# Patient Record
Sex: Female | Born: 1935 | Race: White | Hispanic: No | Marital: Married | Smoking: Never smoker
Health system: Southern US, Community
[De-identification: ages and names within clinical notes are randomized; demographics above are authoritative.]

## PROBLEM LIST (undated history)

## (undated) DIAGNOSIS — M419 Scoliosis, unspecified: Secondary | ICD-10-CM

## (undated) DIAGNOSIS — M810 Age-related osteoporosis without current pathological fracture: Secondary | ICD-10-CM

## (undated) DIAGNOSIS — M1990 Unspecified osteoarthritis, unspecified site: Secondary | ICD-10-CM

## (undated) HISTORY — PX: ABDOMINAL HYSTERECTOMY: SHX81

---

## 2017-01-08 ENCOUNTER — Emergency Department (HOSPITAL_COMMUNITY): Payer: Medicare Other

## 2017-01-08 ENCOUNTER — Inpatient Hospital Stay (HOSPITAL_COMMUNITY)
Admission: EM | Admit: 2017-01-08 | Discharge: 2017-01-21 | DRG: 208 | Disposition: E | Payer: Medicare Other | Attending: General Surgery | Admitting: General Surgery

## 2017-01-08 ENCOUNTER — Encounter (HOSPITAL_COMMUNITY): Payer: Self-pay

## 2017-01-08 DIAGNOSIS — N179 Acute kidney failure, unspecified: Secondary | ICD-10-CM | POA: Diagnosis not present

## 2017-01-08 DIAGNOSIS — S2241XA Multiple fractures of ribs, right side, initial encounter for closed fracture: Principal | ICD-10-CM | POA: Diagnosis present

## 2017-01-08 DIAGNOSIS — Z515 Encounter for palliative care: Secondary | ICD-10-CM | POA: Diagnosis not present

## 2017-01-08 DIAGNOSIS — E876 Hypokalemia: Secondary | ICD-10-CM | POA: Diagnosis not present

## 2017-01-08 DIAGNOSIS — J9601 Acute respiratory failure with hypoxia: Secondary | ICD-10-CM

## 2017-01-08 DIAGNOSIS — S2249XA Multiple fractures of ribs, unspecified side, initial encounter for closed fracture: Secondary | ICD-10-CM

## 2017-01-08 DIAGNOSIS — S270XXA Traumatic pneumothorax, initial encounter: Secondary | ICD-10-CM | POA: Diagnosis present

## 2017-01-08 DIAGNOSIS — Z978 Presence of other specified devices: Secondary | ICD-10-CM

## 2017-01-08 DIAGNOSIS — Z880 Allergy status to penicillin: Secondary | ICD-10-CM

## 2017-01-08 DIAGNOSIS — I472 Ventricular tachycardia, unspecified: Secondary | ICD-10-CM

## 2017-01-08 DIAGNOSIS — Z7189 Other specified counseling: Secondary | ICD-10-CM

## 2017-01-08 DIAGNOSIS — R579 Shock, unspecified: Secondary | ICD-10-CM

## 2017-01-08 DIAGNOSIS — R739 Hyperglycemia, unspecified: Secondary | ICD-10-CM | POA: Diagnosis not present

## 2017-01-08 DIAGNOSIS — J80 Acute respiratory distress syndrome: Secondary | ICD-10-CM | POA: Diagnosis not present

## 2017-01-08 DIAGNOSIS — Z66 Do not resuscitate: Secondary | ICD-10-CM | POA: Diagnosis not present

## 2017-01-08 DIAGNOSIS — W182XXA Fall in (into) shower or empty bathtub, initial encounter: Secondary | ICD-10-CM | POA: Diagnosis present

## 2017-01-08 DIAGNOSIS — A419 Sepsis, unspecified organism: Secondary | ICD-10-CM | POA: Diagnosis not present

## 2017-01-08 DIAGNOSIS — Y92002 Bathroom of unspecified non-institutional (private) residence single-family (private) house as the place of occurrence of the external cause: Secondary | ICD-10-CM

## 2017-01-08 DIAGNOSIS — Z9689 Presence of other specified functional implants: Secondary | ICD-10-CM

## 2017-01-08 DIAGNOSIS — G9340 Encephalopathy, unspecified: Secondary | ICD-10-CM | POA: Diagnosis not present

## 2017-01-08 DIAGNOSIS — R140 Abdominal distension (gaseous): Secondary | ICD-10-CM

## 2017-01-08 DIAGNOSIS — R6521 Severe sepsis with septic shock: Secondary | ICD-10-CM | POA: Diagnosis not present

## 2017-01-08 DIAGNOSIS — K56609 Unspecified intestinal obstruction, unspecified as to partial versus complete obstruction: Secondary | ICD-10-CM

## 2017-01-08 DIAGNOSIS — E872 Acidosis: Secondary | ICD-10-CM | POA: Diagnosis not present

## 2017-01-08 DIAGNOSIS — J90 Pleural effusion, not elsewhere classified: Secondary | ICD-10-CM | POA: Diagnosis not present

## 2017-01-08 DIAGNOSIS — I469 Cardiac arrest, cause unspecified: Secondary | ICD-10-CM

## 2017-01-08 DIAGNOSIS — R570 Cardiogenic shock: Secondary | ICD-10-CM | POA: Diagnosis not present

## 2017-01-08 DIAGNOSIS — J690 Pneumonitis due to inhalation of food and vomit: Secondary | ICD-10-CM | POA: Diagnosis not present

## 2017-01-08 HISTORY — DX: Scoliosis, unspecified: M41.9

## 2017-01-08 HISTORY — DX: Unspecified osteoarthritis, unspecified site: M19.90

## 2017-01-08 HISTORY — DX: Age-related osteoporosis without current pathological fracture: M81.0

## 2017-01-08 LAB — CBC
HCT: 40.3 % (ref 36.0–46.0)
Hemoglobin: 13.3 g/dL (ref 12.0–15.0)
MCH: 30.9 pg (ref 26.0–34.0)
MCHC: 33 g/dL (ref 30.0–36.0)
MCV: 93.5 fL (ref 78.0–100.0)
PLATELETS: 221 10*3/uL (ref 150–400)
RBC: 4.31 MIL/uL (ref 3.87–5.11)
RDW: 13.9 % (ref 11.5–15.5)
WBC: 11.3 10*3/uL — AB (ref 4.0–10.5)

## 2017-01-08 LAB — BASIC METABOLIC PANEL
Anion gap: 9 (ref 5–15)
BUN: 26 mg/dL — ABNORMAL HIGH (ref 6–20)
CALCIUM: 10.3 mg/dL (ref 8.9–10.3)
CO2: 26 mmol/L (ref 22–32)
CREATININE: 0.86 mg/dL (ref 0.44–1.00)
Chloride: 104 mmol/L (ref 101–111)
GFR calc non Af Amer: 60 mL/min — ABNORMAL LOW (ref >60)
Glucose, Bld: 134 mg/dL — ABNORMAL HIGH (ref 65–99)
Potassium: 4.4 mmol/L (ref 3.5–5.1)
SODIUM: 139 mmol/L (ref 135–145)

## 2017-01-08 LAB — PROTIME-INR
INR: 0.97
PROTHROMBIN TIME: 12.8 s (ref 11.4–15.2)

## 2017-01-08 MED ORDER — IOPAMIDOL (ISOVUE-300) INJECTION 61%
INTRAVENOUS | Status: AC
  Administered 2017-01-08: 100 mL via INTRAVENOUS
  Filled 2017-01-08: qty 100

## 2017-01-08 MED ORDER — FENTANYL CITRATE (PF) 100 MCG/2ML IJ SOLN
50.0000 ug | Freq: Once | INTRAMUSCULAR | Status: AC
  Administered 2017-01-08: 50 ug via INTRAVENOUS
  Filled 2017-01-08: qty 2

## 2017-01-08 NOTE — ED Notes (Signed)
ED Provider at bedside. 

## 2017-01-08 NOTE — ED Provider Notes (Signed)
[facility] EMERGENCY DEPARTMENT Provider Note   CSN: [id number] Arrival date & time: 2017/01/25  1734     History   Chief Complaint Chief Complaint  Patient presents with  . Fall    HPI Kinley Dozier is a 83 y.o. female.  HPI  The patient is an 83 year old female, she has no significant medical problems other than osteoporosis and some arthritis.  She was in her usual state of health at home, she was trying to get out of the bathtub sitting on the edge when she slipped and fell striking her right sided anterolateral and posterior ribs on the edge of the tub.  She had acute onset of pain which has been persistent throughout the day today.  Despite having this injury she did go to church, had a rather regular activity day despite having increased amounts of pain in her ribs.  This does get worse with yawning, with trying to use her right arm to push up and with taking deep breaths though she does not have any coughing.  She does not feel particularly short of breath and her pain is minimal when she is lying still.  She denies head injury, neck injury, arm or leg injury or deformity.  She was seen at the urgent care this evening, x-rays were obtained showing for rib fractures, they felt the films were inadequate so they sent her for CT scan.  The patient does have some right upper quadrant tenderness as well.  Past Medical History:  Diagnosis Date  . Arthritis   . Osteoporosis   . Scoliosis     There are no active problems to display for this patient.   Past Surgical History:  Procedure Laterality Date  . ABDOMINAL HYSTERECTOMY      OB History    No data available       Home Medications    Prior to Admission medications   Medication Sig Start Date End Date Taking? Authorizing Provider  fluticasone (FLONASE) 50 MCG/ACT nasal spray Place 1-2 sprays daily into both nostrils. 12/14/16   [provider]  lisinopril (PRINIVIL,ZESTRIL) 10 MG tablet  Take 10 mg daily by mouth. 10/27/16   [provider]  pravastatin (PRAVACHOL) 40 MG tablet Take 40 mg daily by mouth. 12/03/16   [provider]    Family History No family history on file.  Social History Social History   Tobacco Use  . Smoking status: Never Smoker  . Smokeless tobacco: Never Used  Substance Use Topics  . Alcohol use: No    Frequency: Never  . Drug use: No     Allergies   Amoxicillin and Penicillins   Review of Systems Review of Systems  All other systems reviewed and are negative.    Physical Exam Updated Vital Signs BP (!) 126/55   Pulse 93   Temp 97.7 F (36.5 C) (Oral)   Resp 15   Ht 5' (1.524 m)   Wt 56.7 kg (125 lb)   SpO2 96%   BMI 24.41 kg/m   Physical Exam  Constitutional: She appears well-nourished. No distress.  HENT:  Head: Normocephalic and atraumatic.  no facial tenderness, deformity, malocclusion or hemotympanum.  no battle's sign or racoon eyes.   Eyes: Conjunctivae and EOM are normal. Pupils are equal, round, and reactive to light. Right eye exhibits no discharge. Left eye exhibits no discharge. No scleral icterus.  Neck: Normal range of motion. Neck supple.  Cardiovascular: Normal rate, regular rhythm and intact distal  pulses.  Pulmonary/Chest: Effort normal and breath sounds normal. She exhibits no tenderness.  Abdominal: Soft. There is no tenderness.  Musculoskeletal: She exhibits tenderness ( Tender to palpation over the right anterolateral and posterior ribs). She exhibits no edema.  Diffusely soft compartments, supple joints, range of motion of all major joints is normal, normal grips, able to straight leg raise bilaterally  Neurological:  Speech is clear, movements are coordinated, strength is normal in all 4 extremities, cranial nerves III through XII are normal  Skin: Skin is warm and dry. No rash noted.  Nursing note and vitals reviewed.    ED Treatments / Results  Labs (all labs ordered are  listed, but only abnormal results are displayed) Labs Reviewed  CBC - Abnormal; Notable for the following components:      Result Value   WBC 11.3 (*)    All other components within normal limits  BASIC METABOLIC PANEL - Abnormal; Notable for the following components:   Glucose, Bld 134 (*)    BUN 26 (*)    GFR calc non Af Amer 60 (*)    All other components within normal limits  PROTIME-INR    EKG  EKG Interpretation  Date/Time:  Sunday Jan 15, 2017 20:54:41 EST Ventricular Rate:  82 PR Interval:  138 QRS Duration: 90 QT Interval:  374 QTC Calculation: 436 R Axis:   88 Text Interpretation:  Normal sinus rhythm with sinus arrhythmia Possible Left atrial enlargement Borderline ECG No old tracing to compare Confirmed by Eber Hong ([id number]) on 01/15/2017 9:12:50 PM       Radiology Ct Chest W Contrast  Result Date: 01-15-2017 CLINICAL DATA:  Patient fell from sitting on the side of the tub. Struck the right side. Pain along the right costal margin. EXAM: CT CHEST, ABDOMEN, AND PELVIS WITH CONTRAST TECHNIQUE: Multidetector CT imaging of the chest, abdomen and pelvis was performed following the standard protocol during bolus administration of intravenous contrast. CONTRAST:  ISOVUE-300 IOPAMIDOL (ISOVUE-300) INJECTION 61% COMPARISON:  None. FINDINGS: CT CHEST FINDINGS Cardiovascular: Normal heart size. No pericardial effusion. Coronary artery and aortic valve calcifications. Calcification of the aorta. No aneurysm or dissection. Mediastinum/Nodes: Esophagus is decompressed. No abnormal mediastinal gas or fluid collections. No significant lymphadenopathy in the chest. Lungs/Pleura: Small pleural thickening or effusion on the right. Atelectasis in both lung bases. No focal consolidation. No pneumothorax. Airways are patent. Musculoskeletal: Degenerative changes throughout the thoracic spine. No vertebral compression deformities. No sternal depression. Acute comminuted and  depressed fractures are demonstrated to the right posterior 11, 10, 9, 8, 7, and sixth ribs. Soft tissue hematoma in the paraspinal spaces. Degenerative changes in the shoulders. No acute left rib fractures. CT ABDOMEN PELVIS FINDINGS Hepatobiliary: Mild diffuse fatty infiltration of the liver. No focal liver lesions identified. Gallbladder and bile ducts are unremarkable. Pancreas: Unremarkable. No pancreatic ductal dilatation or surrounding inflammatory changes. Spleen: No splenic injury or perisplenic hematoma. Adrenals/Urinary Tract: No adrenal hemorrhage or renal injury identified. Parapelvic cysts on the left kidney. Bladder is unremarkable. Stomach/Bowel: Lower esophagus and stomach are unremarkable. Mildly dilated predominantly fluid-filled mid abdominal small bowel. Distal small bowel are relatively decompressed. Transition zone appears to be in the right lower quadrant. No masses identified. Likely this indicates a adhesions. No wall thickening is appreciated. Appendix is not identified. Colon is nondistended and stool-filled. Diverticulosis of the sigmoid colon without evidence of diverticulitis. Vascular/Lymphatic: Aortic atherosclerosis. No enlarged abdominal or pelvic lymph nodes. Reproductive: Status post hysterectomy. No adnexal masses. Other: No  free air or free fluid in the abdomen. Abdominal wall musculature appears intact. Musculoskeletal: Degenerative changes in the spine and hips. Spondylolysis with mild spondylolisthesis at L5-S1. No acute displaced fractures identified. IMPRESSION: 1. Multiple acute comminuted and depressed right posterior rib fractures. Associated chest wall hematoma, small right pleural effusion, and atelectasis in the lung bases. 2. No acute mediastinal injury is demonstrated. 3. No acute posttraumatic changes demonstrated in the abdomen or pelvis. No solid organ injury or bowel perforation. 4. Changes of small bowel obstruction, transition zone in the right lower  quadrant. Likely etiology is adhesion. 5. Aortic atherosclerosis. 6. Spondylolysis with mild spondylolisthesis at L5-S1. Electronically Signed   By: Burman NievesWilliam  Stevens M.D.   On: 01/05/2017 22:48   Ct Abdomen Pelvis W Contrast  Result Date: 01/05/2017 CLINICAL DATA:  Patient fell from sitting on the side of the tub. Struck the right side. Pain along the right costal margin. EXAM: CT CHEST, ABDOMEN, AND PELVIS WITH CONTRAST TECHNIQUE: Multidetector CT imaging of the chest, abdomen and pelvis was performed following the standard protocol during bolus administration of intravenous contrast. CONTRAST:  100mL ISOVUE-300 IOPAMIDOL (ISOVUE-300) INJECTION 61% COMPARISON:  None. FINDINGS: CT CHEST FINDINGS Cardiovascular: Normal heart size. No pericardial effusion. Coronary artery and aortic valve calcifications. Calcification of the aorta. No aneurysm or dissection. Mediastinum/Nodes: Esophagus is decompressed. No abnormal mediastinal gas or fluid collections. No significant lymphadenopathy in the chest. Lungs/Pleura: Small pleural thickening or effusion on the right. Atelectasis in both lung bases. No focal consolidation. No pneumothorax. Airways are patent. Musculoskeletal: Degenerative changes throughout the thoracic spine. No vertebral compression deformities. No sternal depression. Acute comminuted and depressed fractures are demonstrated to the right posterior 11, 10, 9, 8, 7, and sixth ribs. Soft tissue hematoma in the paraspinal spaces. Degenerative changes in the shoulders. No acute left rib fractures. CT ABDOMEN PELVIS FINDINGS Hepatobiliary: Mild diffuse fatty infiltration of the liver. No focal liver lesions identified. Gallbladder and bile ducts are unremarkable. Pancreas: Unremarkable. No pancreatic ductal dilatation or surrounding inflammatory changes. Spleen: No splenic injury or perisplenic hematoma. Adrenals/Urinary Tract: No adrenal hemorrhage or renal injury identified. Parapelvic cysts on the left  kidney. Bladder is unremarkable. Stomach/Bowel: Lower esophagus and stomach are unremarkable. Mildly dilated predominantly fluid-filled mid abdominal small bowel. Distal small bowel are relatively decompressed. Transition zone appears to be in the right lower quadrant. No masses identified. Likely this indicates a adhesions. No wall thickening is appreciated. Appendix is not identified. Colon is nondistended and stool-filled. Diverticulosis of the sigmoid colon without evidence of diverticulitis. Vascular/Lymphatic: Aortic atherosclerosis. No enlarged abdominal or pelvic lymph nodes. Reproductive: Status post hysterectomy. No adnexal masses. Other: No free air or free fluid in the abdomen. Abdominal wall musculature appears intact. Musculoskeletal: Degenerative changes in the spine and hips. Spondylolysis with mild spondylolisthesis at L5-S1. No acute displaced fractures identified. IMPRESSION: 1. Multiple acute comminuted and depressed right posterior rib fractures. Associated chest wall hematoma, small right pleural effusion, and atelectasis in the lung bases. 2. No acute mediastinal injury is demonstrated. 3. No acute posttraumatic changes demonstrated in the abdomen or pelvis. No solid organ injury or bowel perforation. 4. Changes of small bowel obstruction, transition zone in the right lower quadrant. Likely etiology is adhesion. 5. Aortic atherosclerosis. 6. Spondylolysis with mild spondylolisthesis at L5-S1. Electronically Signed   By: Burman NievesWilliam  Stevens M.D.   On: 01/05/2017 22:48    Procedures Procedures (including critical care time)  Medications Ordered in ED Medications  fentaNYL (SUBLIMAZE) injection 50 mcg (50 mcg Intravenous  Given 12/31/2016 2118)  iopamidol (ISOVUE-300) 61 % injection (100 mLs Intravenous Contrast Given 12/28/2016 2154)     Initial Impression / Assessment and Plan / ED Course  I have reviewed the triage vital signs and the nursing notes.  Pertinent labs & imaging results  that were available during my care of the patient were reviewed by me and considered in my medical decision making (see chart for details).    X-rays reveal fractures based on report from urgent care, CT scan ordered to rule out pulmonary contusion, liver injury or other acute findings.  Incentive spirometry, pain control.  Labs reviewed and are unremarkable  The patient has multiple rib fractures accounting to #5, posterior, right-sided.  There is also a small bowel obstruction.  Labs unremarkable  Discussed with Dr. Lindie SpruceWyatt of the trauma surgery service who will admit the patient to the hospital.  Final Clinical Impressions(s) / ED Diagnoses   Final diagnoses:  Closed fracture of multiple ribs of right side, initial encounter  Small bowel obstruction [facility](HCC)    ED Discharge Orders    None       Eber HongMiller, Princess Karnes, MD 12/25/2016 2307

## 2017-01-08 NOTE — ED Triage Notes (Signed)
Per Pt, [facility] is coming from [facility] where she was evaluated for a fall. Pt reports that she was sitting on the side of the tub when she fell and hit her side along the tub. Denies hitting her head. [facility] CT [facility].

## 2017-01-09 ENCOUNTER — Observation Stay (HOSPITAL_COMMUNITY): Payer: Medicare Other

## 2017-01-09 ENCOUNTER — Other Ambulatory Visit: Payer: Self-pay

## 2017-01-09 DIAGNOSIS — S270XXA Traumatic pneumothorax, initial encounter: Secondary | ICD-10-CM | POA: Diagnosis present

## 2017-01-09 DIAGNOSIS — R570 Cardiogenic shock: Secondary | ICD-10-CM | POA: Diagnosis not present

## 2017-01-09 DIAGNOSIS — Z880 Allergy status to penicillin: Secondary | ICD-10-CM | POA: Diagnosis not present

## 2017-01-09 DIAGNOSIS — N179 Acute kidney failure, unspecified: Secondary | ICD-10-CM | POA: Diagnosis not present

## 2017-01-09 DIAGNOSIS — J690 Pneumonitis due to inhalation of food and vomit: Secondary | ICD-10-CM | POA: Diagnosis not present

## 2017-01-09 DIAGNOSIS — J90 Pleural effusion, not elsewhere classified: Secondary | ICD-10-CM | POA: Diagnosis not present

## 2017-01-09 DIAGNOSIS — Z515 Encounter for palliative care: Secondary | ICD-10-CM | POA: Diagnosis not present

## 2017-01-09 DIAGNOSIS — S2241XA Multiple fractures of ribs, right side, initial encounter for closed fracture: Secondary | ICD-10-CM | POA: Diagnosis present

## 2017-01-09 DIAGNOSIS — G9340 Encephalopathy, unspecified: Secondary | ICD-10-CM | POA: Diagnosis not present

## 2017-01-09 DIAGNOSIS — R739 Hyperglycemia, unspecified: Secondary | ICD-10-CM | POA: Diagnosis not present

## 2017-01-09 DIAGNOSIS — R579 Shock, unspecified: Secondary | ICD-10-CM | POA: Diagnosis not present

## 2017-01-09 DIAGNOSIS — I472 Ventricular tachycardia: Secondary | ICD-10-CM | POA: Diagnosis not present

## 2017-01-09 DIAGNOSIS — A419 Sepsis, unspecified organism: Secondary | ICD-10-CM | POA: Diagnosis not present

## 2017-01-09 DIAGNOSIS — I469 Cardiac arrest, cause unspecified: Secondary | ICD-10-CM | POA: Diagnosis not present

## 2017-01-09 DIAGNOSIS — Z66 Do not resuscitate: Secondary | ICD-10-CM | POA: Diagnosis not present

## 2017-01-09 DIAGNOSIS — W182XXA Fall in (into) shower or empty bathtub, initial encounter: Secondary | ICD-10-CM | POA: Diagnosis present

## 2017-01-09 DIAGNOSIS — R6521 Severe sepsis with septic shock: Secondary | ICD-10-CM | POA: Diagnosis not present

## 2017-01-09 DIAGNOSIS — K56609 Unspecified intestinal obstruction, unspecified as to partial versus complete obstruction: Secondary | ICD-10-CM | POA: Diagnosis present

## 2017-01-09 DIAGNOSIS — Y92002 Bathroom of unspecified non-institutional (private) residence single-family (private) house as the place of occurrence of the external cause: Secondary | ICD-10-CM | POA: Diagnosis not present

## 2017-01-09 DIAGNOSIS — E876 Hypokalemia: Secondary | ICD-10-CM | POA: Diagnosis not present

## 2017-01-09 DIAGNOSIS — J80 Acute respiratory distress syndrome: Secondary | ICD-10-CM | POA: Diagnosis not present

## 2017-01-09 DIAGNOSIS — S2249XA Multiple fractures of ribs, unspecified side, initial encounter for closed fracture: Secondary | ICD-10-CM | POA: Diagnosis present

## 2017-01-09 DIAGNOSIS — E872 Acidosis: Secondary | ICD-10-CM | POA: Diagnosis not present

## 2017-01-09 LAB — BASIC METABOLIC PANEL
ANION GAP: 8 (ref 5–15)
BUN: 21 mg/dL — AB (ref 6–20)
CO2: 26 mmol/L (ref 22–32)
CREATININE: 0.87 mg/dL (ref 0.44–1.00)
Calcium: 9.6 mg/dL (ref 8.9–10.3)
Chloride: 105 mmol/L (ref 101–111)
GFR calc non Af Amer: 59 mL/min — ABNORMAL LOW (ref >60)
Glucose, Bld: 124 mg/dL — ABNORMAL HIGH (ref 65–99)
Potassium: 4.5 mmol/L (ref 3.5–5.1)
Sodium: 139 mmol/L (ref 135–145)

## 2017-01-09 LAB — CBC
HCT: 37.2 % (ref 36.0–46.0)
Hemoglobin: 12.4 g/dL (ref 12.0–15.0)
MCH: 31.6 pg (ref 26.0–34.0)
MCHC: 33.3 g/dL (ref 30.0–36.0)
MCV: 94.9 fL (ref 78.0–100.0)
PLATELETS: 200 10*3/uL (ref 150–400)
RBC: 3.92 MIL/uL (ref 3.87–5.11)
RDW: 14.3 % (ref 11.5–15.5)
WBC: 9.9 10*3/uL (ref 4.0–10.5)

## 2017-01-09 MED ORDER — ENOXAPARIN SODIUM 40 MG/0.4ML ~~LOC~~ SOLN
40.0000 mg | SUBCUTANEOUS | Status: DC
  Administered 2017-01-10: 40 mg via SUBCUTANEOUS
  Filled 2017-01-09: qty 0.4

## 2017-01-09 MED ORDER — ONDANSETRON 4 MG PO TBDP: 4.0000 mg | ORAL_TABLET | Freq: Four times a day (QID) | ORAL | Status: DC | PRN

## 2017-01-09 MED ORDER — HYDROMORPHONE HCL 1 MG/ML IJ SOLN
0.2500 mg | INTRAMUSCULAR | Status: DC | PRN
  Administered 2017-01-09: 0.5 mg via INTRAVENOUS
  Filled 2017-01-09: qty 1

## 2017-01-09 MED ORDER — ONDANSETRON HCL 4 MG/2ML IJ SOLN
4.0000 mg | Freq: Four times a day (QID) | INTRAMUSCULAR | Status: DC | PRN
  Administered 2017-01-09: 4 mg via INTRAVENOUS
  Filled 2017-01-09: qty 2

## 2017-01-09 MED ORDER — SODIUM CHLORIDE 0.9 % IV SOLN
INTRAVENOUS | Status: DC
  Administered 2017-01-09 (×2): via INTRAVENOUS

## 2017-01-09 MED ORDER — PANTOPRAZOLE SODIUM 40 MG IV SOLR
40.0000 mg | Freq: Every day | INTRAVENOUS | Status: DC
Start: 2017-01-09 — End: 2017-01-09

## 2017-01-09 MED ORDER — PROCHLORPERAZINE EDISYLATE 5 MG/ML IJ SOLN
5.0000 mg | Freq: Four times a day (QID) | INTRAMUSCULAR | Status: DC | PRN
  Filled 2017-01-09: qty 2

## 2017-01-09 MED ORDER — PANTOPRAZOLE SODIUM 40 MG PO TBEC
40.0000 mg | DELAYED_RELEASE_TABLET | Freq: Every day | ORAL | Status: DC
  Administered 2017-01-09: 40 mg via ORAL
  Filled 2017-01-09: qty 1

## 2017-01-09 MED ORDER — ACETAMINOPHEN 500 MG PO TABS
1000.0000 mg | ORAL_TABLET | Freq: Three times a day (TID) | ORAL | Status: DC
  Administered 2017-01-09 – 2017-01-10 (×4): 1000 mg via ORAL
  Filled 2017-01-09 (×4): qty 2

## 2017-01-09 MED ORDER — IBUPROFEN 200 MG PO TABS: 600.0000 mg | ORAL_TABLET | Freq: Four times a day (QID) | ORAL | Status: DC | PRN

## 2017-01-09 MED ORDER — PROCHLORPERAZINE MALEATE 10 MG PO TABS
10.0000 mg | ORAL_TABLET | Freq: Four times a day (QID) | ORAL | Status: DC | PRN
  Filled 2017-01-09: qty 1

## 2017-01-09 MED ORDER — LISINOPRIL 10 MG PO TABS
10.0000 mg | ORAL_TABLET | Freq: Every day | ORAL | Status: DC
  Administered 2017-01-09: 10 mg via ORAL
  Filled 2017-01-09: qty 1

## 2017-01-09 MED ORDER — METHOCARBAMOL 500 MG PO TABS
500.0000 mg | ORAL_TABLET | Freq: Four times a day (QID) | ORAL | Status: DC | PRN
  Administered 2017-01-09: 500 mg via ORAL
  Filled 2017-01-09: qty 1

## 2017-01-09 MED ORDER — CALCIUM CARBONATE ANTACID 500 MG PO CHEW
1.0000 | CHEWABLE_TABLET | Freq: Three times a day (TID) | ORAL | Status: DC | PRN
  Administered 2017-01-09 (×2): 200 mg via ORAL
  Filled 2017-01-09 (×2): qty 1

## 2017-01-09 MED ORDER — BISACODYL 10 MG RE SUPP
10.0000 mg | Freq: Once | RECTAL | Status: AC
  Administered 2017-01-09: 10 mg via RECTAL
  Filled 2017-01-09: qty 1

## 2017-01-09 MED ORDER — OXYCODONE HCL 5 MG PO TABS: 5.0000 mg | ORAL_TABLET | ORAL | Status: DC | PRN

## 2017-01-09 MED ORDER — FLUTICASONE PROPIONATE 50 MCG/ACT NA SUSP
1.0000 | Freq: Every day | NASAL | Status: DC
  Administered 2017-01-09: 2 via NASAL
  Filled 2017-01-09: qty 16

## 2017-01-09 MED ORDER — PRAVASTATIN SODIUM 40 MG PO TABS
40.0000 mg | ORAL_TABLET | Freq: Every day | ORAL | Status: DC
  Administered 2017-01-09: 40 mg via ORAL
  Filled 2017-01-09: qty 1

## 2017-01-09 NOTE — H&P (Signed)
History   Jamie Raymond is an 83 y.o. female.   Chief Complaint:  Chief Complaint  Patient presents with  . Fall    83 yo female, ground level fall in a tub while trying to get  Out of the tub.  Hit her side on the edge of the tub.  Pain persisted to where she it was not getting any better.  They cam to the ED for evaluation   Trauma Mechanism of injury: fall Injury location: torso Injury location detail: R chest Incident location: home Time since incident: 2 hours Arrived directly from scene: yes   Fall:      Fall occurred: in the bathroom      Height of fall: low      Impact surface: Side of the tub.      Point of impact: right chest wall.  Protective equipment:       None      Suspicion of alcohol use: no      Suspicion of drug use: no  EMS/PTA data:      Bystander interventions: none      Ambulatory at scene: yes      Blood loss: none      Responsiveness: alert      Oriented to: person, place, situation and time      Loss of consciousness: no      Amnesic to event: no      Airway interventions: none      Breathing interventions: oxygen      IV access: established      IO access: none      Fluids administered: none      Cardiac interventions: none      Medications administered: fentanyl      Immobilization: none      Airway condition since incident: stable      Breathing condition since incident: stable      Circulation condition since incident: stable      Mental status condition since incident: stable      Disability condition since incident: stable  Current symptoms:      Pain scale: 4/10 (Does not hurt when she does not move)      Pain timing: intermittent      Associated symptoms:            Reports chest pain (right chest wall).            Denies loss of consciousness.   Relevant PMH:      Tetanus status: UTD      The patient has not been admitted to the hospital due to injury in the past year, and has not been treated and released from the ED due  to injury in the past year.   Past Medical History:  Diagnosis Date  . Arthritis   . Osteoporosis   . Scoliosis     Past Surgical History:  Procedure Laterality Date  . ABDOMINAL HYSTERECTOMY      No family history on file. Social History:  reports that  has never smoked. she has never used smokeless tobacco. She reports that she does not drink alcohol or use drugs.  Allergies   Allergies  Allergen Reactions  . Amoxicillin   . Penicillins     Home Medications   (Not in a hospital admission)  Trauma Course   Results for orders placed or performed during the hospital encounter of 01/17/2017 (from the past 48 hour(s))  CBC     Status:  Abnormal   Collection Time: 01/04/2017  6:58 PM  Result Value Ref Range   WBC 11.3 (H) 4.0 - 10.5 K/uL   RBC 4.31 3.87 - 5.11 MIL/uL   Hemoglobin 13.3 12.0 - 15.0 g/dL   HCT 40.3 36.0 - 46.0 %   MCV 93.5 78.0 - 100.0 fL   MCH 30.9 26.0 - 34.0 pg   MCHC 33.0 30.0 - 36.0 g/dL   RDW 13.9 11.5 - 15.5 %   Platelets 221 150 - 400 K/uL  Protime-INR     Status: None   Collection Time: 12/22/2016  6:58 PM  Result Value Ref Range   Prothrombin Time 12.8 11.4 - 15.2 seconds   INR 4.19   Basic metabolic panel     Status: Abnormal   Collection Time: 12/28/2016  6:58 PM  Result Value Ref Range   Sodium 139 135 - 145 mmol/L   Potassium 4.4 3.5 - 5.1 mmol/L   Chloride 104 101 - 111 mmol/L   CO2 26 22 - 32 mmol/L   Glucose, Bld 134 (H) 65 - 99 mg/dL   BUN 26 (H) 6 - 20 mg/dL   Creatinine, Ser 0.86 0.44 - 1.00 mg/dL   Calcium 10.3 8.9 - 10.3 mg/dL   GFR calc non Af Amer 60 (L) >60 mL/min   GFR calc Af Amer >60 >60 mL/min    Comment: (NOTE) The eGFR has been calculated using the CKD EPI equation. This calculation has not been validated in all clinical situations. eGFR's persistently <60 mL/min signify possible Chronic Kidney Disease.    Anion gap 9 5 - 15   Ct Chest W Contrast  Result Date: 01/16/2017 CLINICAL DATA:  Patient fell from sitting  on the side of the tub. Struck the right side. Pain along the right costal margin. EXAM: CT CHEST, ABDOMEN, AND PELVIS WITH CONTRAST TECHNIQUE: Multidetector CT imaging of the chest, abdomen and pelvis was performed following the standard protocol during bolus administration of intravenous contrast. CONTRAST:  148m ISOVUE-300 IOPAMIDOL (ISOVUE-300) INJECTION 61% COMPARISON:  None. FINDINGS: CT CHEST FINDINGS Cardiovascular: Normal heart size. No pericardial effusion. Coronary artery and aortic valve calcifications. Calcification of the aorta. No aneurysm or dissection. Mediastinum/Nodes: Esophagus is decompressed. No abnormal mediastinal gas or fluid collections. No significant lymphadenopathy in the chest. Lungs/Pleura: Small pleural thickening or effusion on the right. Atelectasis in both lung bases. No focal consolidation. No pneumothorax. Airways are patent. Musculoskeletal: Degenerative changes throughout the thoracic spine. No vertebral compression deformities. No sternal depression. Acute comminuted and depressed fractures are demonstrated to the right posterior 11, 10, 9, 8, 7, and sixth ribs. Soft tissue hematoma in the paraspinal spaces. Degenerative changes in the shoulders. No acute left rib fractures. CT ABDOMEN PELVIS FINDINGS Hepatobiliary: Mild diffuse fatty infiltration of the liver. No focal liver lesions identified. Gallbladder and bile ducts are unremarkable. Pancreas: Unremarkable. No pancreatic ductal dilatation or surrounding inflammatory changes. Spleen: No splenic injury or perisplenic hematoma. Adrenals/Urinary Tract: No adrenal hemorrhage or renal injury identified. Parapelvic cysts on the left kidney. Bladder is unremarkable. Stomach/Bowel: Lower esophagus and stomach are unremarkable. Mildly dilated predominantly fluid-filled mid abdominal small bowel. Distal small bowel are relatively decompressed. Transition zone appears to be in the right lower quadrant. No masses identified. Likely  this indicates a adhesions. No wall thickening is appreciated. Appendix is not identified. Colon is nondistended and stool-filled. Diverticulosis of the sigmoid colon without evidence of diverticulitis. Vascular/Lymphatic: Aortic atherosclerosis. No enlarged abdominal or pelvic lymph nodes. Reproductive: Status post hysterectomy.  No adnexal masses. Other: No free air or free fluid in the abdomen. Abdominal wall musculature appears intact. Musculoskeletal: Degenerative changes in the spine and hips. Spondylolysis with mild spondylolisthesis at L5-S1. No acute displaced fractures identified. IMPRESSION: 1. Multiple acute comminuted and depressed right posterior rib fractures. Associated chest wall hematoma, small right pleural effusion, and atelectasis in the lung bases. 2. No acute mediastinal injury is demonstrated. 3. No acute posttraumatic changes demonstrated in the abdomen or pelvis. No solid organ injury or bowel perforation. 4. Changes of small bowel obstruction, transition zone in the right lower quadrant. Likely etiology is adhesion. 5. Aortic atherosclerosis. 6. Spondylolysis with mild spondylolisthesis at L5-S1. Electronically Signed   By: Lucienne Capers M.D.   On: 01/05/2017 22:48   Ct Abdomen Pelvis W Contrast  Result Date: 01/01/2017 CLINICAL DATA:  Patient fell from sitting on the side of the tub. Struck the right side. Pain along the right costal margin. EXAM: CT CHEST, ABDOMEN, AND PELVIS WITH CONTRAST TECHNIQUE: Multidetector CT imaging of the chest, abdomen and pelvis was performed following the standard protocol during bolus administration of intravenous contrast. CONTRAST:  139m ISOVUE-300 IOPAMIDOL (ISOVUE-300) INJECTION 61% COMPARISON:  None. FINDINGS: CT CHEST FINDINGS Cardiovascular: Normal heart size. No pericardial effusion. Coronary artery and aortic valve calcifications. Calcification of the aorta. No aneurysm or dissection. Mediastinum/Nodes: Esophagus is decompressed. No  abnormal mediastinal gas or fluid collections. No significant lymphadenopathy in the chest. Lungs/Pleura: Small pleural thickening or effusion on the right. Atelectasis in both lung bases. No focal consolidation. No pneumothorax. Airways are patent. Musculoskeletal: Degenerative changes throughout the thoracic spine. No vertebral compression deformities. No sternal depression. Acute comminuted and depressed fractures are demonstrated to the right posterior 11, 10, 9, 8, 7, and sixth ribs. Soft tissue hematoma in the paraspinal spaces. Degenerative changes in the shoulders. No acute left rib fractures. CT ABDOMEN PELVIS FINDINGS Hepatobiliary: Mild diffuse fatty infiltration of the liver. No focal liver lesions identified. Gallbladder and bile ducts are unremarkable. Pancreas: Unremarkable. No pancreatic ductal dilatation or surrounding inflammatory changes. Spleen: No splenic injury or perisplenic hematoma. Adrenals/Urinary Tract: No adrenal hemorrhage or renal injury identified. Parapelvic cysts on the left kidney. Bladder is unremarkable. Stomach/Bowel: Lower esophagus and stomach are unremarkable. Mildly dilated predominantly fluid-filled mid abdominal small bowel. Distal small bowel are relatively decompressed. Transition zone appears to be in the right lower quadrant. No masses identified. Likely this indicates a adhesions. No wall thickening is appreciated. Appendix is not identified. Colon is nondistended and stool-filled. Diverticulosis of the sigmoid colon without evidence of diverticulitis. Vascular/Lymphatic: Aortic atherosclerosis. No enlarged abdominal or pelvic lymph nodes. Reproductive: Status post hysterectomy. No adnexal masses. Other: No free air or free fluid in the abdomen. Abdominal wall musculature appears intact. Musculoskeletal: Degenerative changes in the spine and hips. Spondylolysis with mild spondylolisthesis at L5-S1. No acute displaced fractures identified. IMPRESSION: 1. Multiple acute  comminuted and depressed right posterior rib fractures. Associated chest wall hematoma, small right pleural effusion, and atelectasis in the lung bases. 2. No acute mediastinal injury is demonstrated. 3. No acute posttraumatic changes demonstrated in the abdomen or pelvis. No solid organ injury or bowel perforation. 4. Changes of small bowel obstruction, transition zone in the right lower quadrant. Likely etiology is adhesion. 5. Aortic atherosclerosis. 6. Spondylolysis with mild spondylolisthesis at L5-S1. Electronically Signed   By: WLucienne CapersM.D.   On: 01/19/2017 22:48    Review of Systems  Constitutional: Negative.   HENT: Negative.   Eyes: Negative.  Respiratory: Negative for shortness of breath.   Cardiovascular: Positive for chest pain (right chest wall).  Gastrointestinal: Negative.   Genitourinary: Negative.   Musculoskeletal: Negative.   Skin: Negative.   Neurological: Negative for loss of consciousness.    Blood pressure (!) 147/66, pulse (!) 104, temperature 97.7 F (36.5 C), temperature source Oral, resp. rate (!) 29, height 5' (1.524 m), weight 56.7 kg (125 lb), SpO2 99 %. Physical Exam  Nursing note and vitals reviewed. Constitutional: She is oriented to person, place, and time. She appears well-developed and well-nourished.  HENT:  Head: Normocephalic and atraumatic.  Eyes: Conjunctivae and EOM are normal. Pupils are equal, round, and reactive to light.  Neck: Normal range of motion. Neck supple.  Respiratory: Effort normal and breath sounds normal. She exhibits tenderness, bony tenderness and crepitus (right chest wall from moving ribs.). She exhibits no laceration, no deformity and no retraction.  IS measured at 1 liter  GI: Soft. Bowel sounds are normal.  Musculoskeletal: Normal range of motion.  Neurological: She is alert and oriented to person, place, and time. She has normal reflexes.  Skin: Skin is warm and dry.  Psychiatric: She has a normal mood and  affect. Her behavior is normal. Judgment and thought content normal.     Assessment/Plan Fall in bath tube Multiple right sided rib fractures, minimally symptomatic.  No pneumothorax, no hemothorax  Admit for pain control.  IV hydration, patient and her husband are from [place] and here for vacation. Once her pain is controlled and IS is satisfactory, she will be able to go home.  Judeth Horn 01/09/2017, 12:43 AM   Procedures

## 2017-01-09 NOTE — Progress Notes (Signed)
[facility]/Trauma Progress Note      Assessment/Plan Hx of scoliosis Osteoporosis Arthritis   Fall R posterior 7-11 rib fractures  Chest wall hematoma Small R pleural effusion - pulmonary toileting, IS, pain control  FEN: reg diet VTE: SCD's, lovenox ID: none Foley:  none Follow up: 2 weeks trauma clinic   DISPO: Once pain is controlled, IS is adequate and pending PT, pt can be discharge home.    LOS: 0 days    Subjective:  CC: rib pain  Pain worse with movement, deep breath or yawn. No fevers overnight. No abdominal pain, nausea, vomiting. Pt pulling 1250 on IS.   Objective: Vital signs in last 24 hours: Temp:  [97.7 F (36.5 C)-98.4 F (36.9 C)] 98.4 F (36.9 C) (11/19 0620) Pulse Rate:  [71-104] 71 (11/19 0620) Resp:  [14-29] 19 (11/19 0620) BP: (106-160)/(55-85) 106/58 (11/19 0620) SpO2:  [96 %-100 %] 98 % (11/19 0620) Weight:  [125 lb (56.7 kg)] 125 lb (56.7 kg) (11/18 1746) Last BM Date: 2016-03-19  Intake/Output from previous day: 11/18 0701 - 11/19 0700 In: 131.7 [P.O.:120; I.V.:11.7] Out: 500 [Urine:500] Intake/Output this shift: No intake/output data recorded.  PE: Gen:  Alert, NAD, pleasant, cooperative Card:  RRR, no M/G/R heard Pulm:  CTA, no W/R/R, rate and effort normal, no ecchymosis or deformity noted to posterior ribs Abd: Soft, ND, +BS,  Skin: no rashes noted, warm and dry   Anti-infectives: Anti-infectives (From admission, onward)   None      Lab Results:  Recent Labs    2016-03-19 1858 01/09/17 0444  WBC 11.3* 9.9  HGB 13.3 12.4  HCT 40.3 37.2  PLT 221 200   BMET Recent Labs    2016-03-19 1858 01/09/17 0444  NA 139 139  K 4.4 4.5  CL 104 105  CO2 26 26  GLUCOSE 134* 124*  BUN 26* 21*  CREATININE 0.86 0.87  CALCIUM 10.3 9.6   PT/INR Recent Labs    2016-03-19 1858  LABPROT 12.8  INR 0.97   CMP     Component Value Date/Time   NA 139 01/09/2017 0444   K 4.5 01/09/2017 0444   CL 105 01/09/2017  0444   CO2 26 01/09/2017 0444   GLUCOSE 124 (H) 01/09/2017 0444   BUN 21 (H) 01/09/2017 0444   CREATININE 0.87 01/09/2017 0444   CALCIUM 9.6 01/09/2017 0444   GFRNONAA 59 (L) 01/09/2017 0444   GFRAA >60 01/09/2017 0444   Lipase  No results found for: LIPASE  Studies/Results: Ct Chest W Contrast  Result Date: September 28, 2016 CLINICAL DATA:  Patient fell from sitting on the side of the tub. Struck the right side. Pain along the right costal margin. EXAM: CT CHEST, ABDOMEN, AND PELVIS WITH CONTRAST TECHNIQUE: Multidetector CT imaging of the chest, abdomen and pelvis was performed following the standard protocol during bolus administration of intravenous contrast. CONTRAST:  100mL ISOVUE-300 IOPAMIDOL (ISOVUE-300) INJECTION 61% COMPARISON:  None. FINDINGS: CT CHEST FINDINGS Cardiovascular: Normal heart size. No pericardial effusion. Coronary artery and aortic valve calcifications. Calcification of the aorta. No aneurysm or dissection. Mediastinum/Nodes: Esophagus is decompressed. No abnormal mediastinal gas or fluid collections. No significant lymphadenopathy in the chest. Lungs/Pleura: Small pleural thickening or effusion on the right. Atelectasis in both lung bases. No focal consolidation. No pneumothorax. Airways are patent. Musculoskeletal: Degenerative changes throughout the thoracic spine. No vertebral compression deformities. No sternal depression. Acute comminuted and depressed fractures are demonstrated to the right posterior 11, 10, 9, 8, 7, and sixth  ribs. Soft tissue hematoma in the paraspinal spaces. Degenerative changes in the shoulders. No acute left rib fractures. CT ABDOMEN PELVIS FINDINGS Hepatobiliary: Mild diffuse fatty infiltration of the liver. No focal liver lesions identified. Gallbladder and bile ducts are unremarkable. Pancreas: Unremarkable. No pancreatic ductal dilatation or surrounding inflammatory changes. Spleen: No splenic injury or perisplenic hematoma. Adrenals/Urinary Tract:  No adrenal hemorrhage or renal injury identified. Parapelvic cysts on the left kidney. Bladder is unremarkable. Stomach/Bowel: Lower esophagus and stomach are unremarkable. Mildly dilated predominantly fluid-filled mid abdominal small bowel. Distal small bowel are relatively decompressed. Transition zone appears to be in the right lower quadrant. No masses identified. Likely this indicates a adhesions. No wall thickening is appreciated. Appendix is not identified. Colon is nondistended and stool-filled. Diverticulosis of the sigmoid colon without evidence of diverticulitis. Vascular/Lymphatic: Aortic atherosclerosis. No enlarged abdominal or pelvic lymph nodes. Reproductive: Status post hysterectomy. No adnexal masses. Other: No free air or free fluid in the abdomen. Abdominal wall musculature appears intact. Musculoskeletal: Degenerative changes in the spine and hips. Spondylolysis with mild spondylolisthesis at L5-S1. No acute displaced fractures identified. IMPRESSION: 1. Multiple acute comminuted and depressed right posterior rib fractures. Associated chest wall hematoma, small right pleural effusion, and atelectasis in the lung bases. 2. No acute mediastinal injury is demonstrated. 3. No acute posttraumatic changes demonstrated in the abdomen or pelvis. No solid organ injury or bowel perforation. 4. Changes of small bowel obstruction, transition zone in the right lower quadrant. Likely etiology is adhesion. 5. Aortic atherosclerosis. 6. Spondylolysis with mild spondylolisthesis at L5-S1. Electronically Signed   By: Burman NievesWilliam  Stevens M.D.   On: 03/04/16 22:48   Ct Abdomen Pelvis W Contrast  Result Date: 03/04/16 CLINICAL DATA:  Patient fell from sitting on the side of the tub. Struck the right side. Pain along the right costal margin. EXAM: CT CHEST, ABDOMEN, AND PELVIS WITH CONTRAST TECHNIQUE: Multidetector CT imaging of the chest, abdomen and pelvis was performed following the standard protocol during  bolus administration of intravenous contrast. CONTRAST:  100mL ISOVUE-300 IOPAMIDOL (ISOVUE-300) INJECTION 61% COMPARISON:  None. FINDINGS: CT CHEST FINDINGS Cardiovascular: Normal heart size. No pericardial effusion. Coronary artery and aortic valve calcifications. Calcification of the aorta. No aneurysm or dissection. Mediastinum/Nodes: Esophagus is decompressed. No abnormal mediastinal gas or fluid collections. No significant lymphadenopathy in the chest. Lungs/Pleura: Small pleural thickening or effusion on the right. Atelectasis in both lung bases. No focal consolidation. No pneumothorax. Airways are patent. Musculoskeletal: Degenerative changes throughout the thoracic spine. No vertebral compression deformities. No sternal depression. Acute comminuted and depressed fractures are demonstrated to the right posterior 11, 10, 9, 8, 7, and sixth ribs. Soft tissue hematoma in the paraspinal spaces. Degenerative changes in the shoulders. No acute left rib fractures. CT ABDOMEN PELVIS FINDINGS Hepatobiliary: Mild diffuse fatty infiltration of the liver. No focal liver lesions identified. Gallbladder and bile ducts are unremarkable. Pancreas: Unremarkable. No pancreatic ductal dilatation or surrounding inflammatory changes. Spleen: No splenic injury or perisplenic hematoma. Adrenals/Urinary Tract: No adrenal hemorrhage or renal injury identified. Parapelvic cysts on the left kidney. Bladder is unremarkable. Stomach/Bowel: Lower esophagus and stomach are unremarkable. Mildly dilated predominantly fluid-filled mid abdominal small bowel. Distal small bowel are relatively decompressed. Transition zone appears to be in the right lower quadrant. No masses identified. Likely this indicates a adhesions. No wall thickening is appreciated. Appendix is not identified. Colon is nondistended and stool-filled. Diverticulosis of the sigmoid colon without evidence of diverticulitis. Vascular/Lymphatic: Aortic atherosclerosis. No  enlarged abdominal or pelvic  lymph nodes. Reproductive: Status post hysterectomy. No adnexal masses. Other: No free air or free fluid in the abdomen. Abdominal wall musculature appears intact. Musculoskeletal: Degenerative changes in the spine and hips. Spondylolysis with mild spondylolisthesis at L5-S1. No acute displaced fractures identified. IMPRESSION: 1. Multiple acute comminuted and depressed right posterior rib fractures. Associated chest wall hematoma, small right pleural effusion, and atelectasis in the lung bases. 2. No acute mediastinal injury is demonstrated. 3. No acute posttraumatic changes demonstrated in the abdomen or pelvis. No solid organ injury or bowel perforation. 4. Changes of small bowel obstruction, transition zone in the right lower quadrant. Likely etiology is adhesion. 5. Aortic atherosclerosis. 6. Spondylolysis with mild spondylolisthesis at L5-S1. Electronically Signed   By: Burman Nieves M.D.   On: 01/19/2017 22:48      Jerre Simon , [facility] 01/09/2017, 7:54 AM Pager: [phone] Consults: [phone] Mon-Fri 7:00 am-4:30 pm Sat-Sun 7:00 am-11:30 am

## 2017-01-09 NOTE — Evaluation (Signed)
Physical Therapy Evaluation and Discharge Patient Details Name: Claudia DesanctisSarah Albano MRN: [id number] DOB: December 30, 1929 Today's Date: 01/09/2017   History of Present Illness  83 y.o. female s/p Fall in bath tube, R posterior 7-11 rib fractures , minimally symptomatic   Clinical Impression  Patient evaluated by Physical Therapy with no further acute PT needs identified. All education has been completed and the patient has no further questions. PTA, pt was independent with all mobility. Pt lives with husband who is avail 24/7 for care, however is visiting with family this week for holidays and staying with family as well. Currently, pt is mod I with all mobility and benefits from supervision on stairs. Educated pt on safe techniques for maximizing safety on stair well, as well as minimizing pain during functional mobility. Patient and family have no further questions or concerns at this time and feel she is ready to d/c home when medically cleared.  See below for any follow-up Physical Therapy or equipment needs. PT is signing off. Thank you for this referral.    Follow Up Recommendations No PT follow up    Equipment Recommendations  None recommended by PT    Recommendations for Other Services       Precautions / Restrictions Precautions Precautions: Fall Restrictions Weight Bearing Restrictions: No      Mobility  Bed Mobility Overal bed mobility: Needs Assistance Bed Mobility: Rolling;Sidelying to Sit;Sit to Sidelying Rolling: Supervision Sidelying to sit: Supervision     Sit to sidelying: Supervision General bed mobility comments: Cues for log rolling to avoid pain in rib cage   Transfers Overall transfer level: Modified independent Equipment used: None             General transfer comment: Able to power up without physical assitance  Ambulation/Gait Ambulation/Gait assistance: Supervision Ambulation Distance (Feet): 250 Feet Assistive device: None Gait Pattern/deviations:  WFL(Within Functional Limits);Antalgic Gait velocity: normal      Stairs Stairs: Yes Stairs assistance: Min guard Stair Management: One rail Right;Alternating pattern;Step to pattern Number of Stairs: 24 General stair comments: Cued patient for step-to pattern to maximize safey. Able to ambulate quickly with 1 UE support.   Wheelchair Mobility    Modified Rankin (Stroke Patients Only)       Balance Overall balance assessment: Needs assistance Sitting-balance support: Feet unsupported;No upper extremity supported Sitting balance-Leahy Scale: Normal     Standing balance support: No upper extremity supported;During functional activity Standing balance-Leahy Scale: Good Standing balance comment: Benefits from UE support on stairs                             Pertinent Vitals/Pain Pain Assessment: Faces Faces Pain Scale: Hurts little more Pain Location: R ribs 7-11 Pain Descriptors / Indicators: Grimacing Pain Intervention(s): Limited activity within patient's tolerance;Monitored during session    Home Living Family/patient expects to be discharged to:: Private residence Living Arrangements: Spouse/significant other Available Help at Discharge: Family;Available 24 hours/day Type of Home: House Home Access: Stairs to enter Entrance Stairs-Rails: Right Entrance Stairs-Number of Steps: 2 Home Layout: Two level Home Equipment: Walker - 2 wheels;Cane - single point      Prior Function Level of Independence: Independent               Hand Dominance        Extremity/Trunk Assessment        Lower Extremity Assessment Lower Extremity Assessment: (BLE 4-/5 globally)       Communication  Communication: No difficulties  Cognition Arousal/Alertness: Awake/alert Behavior During Therapy: WFL for tasks assessed/performed Overall Cognitive Status: Within Functional Limits for tasks assessed                                         General Comments General comments (skin integrity, edema, etc.): VSS throughout session. time discussing with family aderessing any concerns and improving bed mobility with patient to avoid pain    Exercises General Exercises - Lower Extremity Long Arc Quad: AROM;Both;10 reps Hip ABduction/ADduction: AROM;Both;10 reps   Assessment/Plan    PT Assessment Patent does not need any further PT services  PT Problem List         PT Treatment Interventions      PT Goals (Current goals can be found in the Care Plan section)  Acute Rehab PT Goals Patient Stated Goal: to return hom PT Goal Formulation: With patient/family Time For Goal Achievement: 01/11/17 Potential to Achieve Goals: Good    Frequency     Barriers to discharge        Co-evaluation               AM-PAC PT "6 Clicks" Daily Activity  Outcome Measure Difficulty turning over in bed (including adjusting bedclothes, sheets and blankets)?: A Little Difficulty moving from lying on back to sitting on the side of the bed? : A Little Difficulty sitting down on and standing up from a chair with arms (e.g., wheelchair, bedside commode, etc,.)?: A Little Help needed moving to and from a bed to chair (including a wheelchair)?: None Help needed walking in hospital room?: None Help needed climbing 3-5 steps with a railing? : A Little 6 Click Score: 20    End of Session Equipment Utilized During Treatment: Gait belt Activity Tolerance: Patient tolerated treatment well Patient left: in bed;with call bell/phone within reach;with family/visitor present Nurse Communication: Mobility status PT Visit Diagnosis: Pain;Muscle weakness (generalized) (M62.81);Difficulty in walking, not elsewhere classified (R26.2) Pain - Right/Left: Right Pain - part of body: (Back/Ribs)    Time: [id number] PT Time Calculation (min) (ACUTE ONLY): 26 min   Charges:   PT Evaluation $PT Eval Low Complexity: 1 Low PT Treatments $Gait Training:  8-22 mins   PT G Codes:   PT G-Codes **NOT FOR INPATIENT CLASS** Functional Assessment Tool Used: AM-PAC 6 Clicks Basic Mobility;Clinical judgement Functional Limitation: Mobility: Walking and moving around Mobility: Walking and Moving Around Current Status ([id number](G8978): At least 20 percent but less than 40 percent impaired, limited or restricted Mobility: Walking and Moving Around Goal Status [phone](G8979): At least 20 percent but less than 40 percent impaired, limited or restricted Mobility: Walking and Moving Around Discharge Status [phone](G8980): At least 20 percent but less than 40 percent impaired, limited or restricted    Etta GrandchildSean Jaquana Geiger, PT, DPT Acute Rehab Services Pager: [phone]     Etta GrandchildSean  Chandan Fly 01/09/2017, 12:41 PM

## 2017-01-10 ENCOUNTER — Inpatient Hospital Stay (HOSPITAL_COMMUNITY): Payer: Medicare Other

## 2017-01-10 ENCOUNTER — Other Ambulatory Visit: Payer: Self-pay

## 2017-01-10 ENCOUNTER — Inpatient Hospital Stay (HOSPITAL_COMMUNITY): Payer: Medicare Other | Admitting: Certified Registered Nurse Anesthetist

## 2017-01-10 DIAGNOSIS — J690 Pneumonitis due to inhalation of food and vomit: Secondary | ICD-10-CM

## 2017-01-10 DIAGNOSIS — R6521 Severe sepsis with septic shock: Secondary | ICD-10-CM

## 2017-01-10 DIAGNOSIS — J80 Acute respiratory distress syndrome: Secondary | ICD-10-CM

## 2017-01-10 DIAGNOSIS — R579 Shock, unspecified: Secondary | ICD-10-CM

## 2017-01-10 DIAGNOSIS — Z7189 Other specified counseling: Secondary | ICD-10-CM

## 2017-01-10 DIAGNOSIS — I469 Cardiac arrest, cause unspecified: Secondary | ICD-10-CM

## 2017-01-10 DIAGNOSIS — A419 Sepsis, unspecified organism: Secondary | ICD-10-CM

## 2017-01-10 DIAGNOSIS — I472 Ventricular tachycardia, unspecified: Secondary | ICD-10-CM

## 2017-01-10 DIAGNOSIS — J9601 Acute respiratory failure with hypoxia: Secondary | ICD-10-CM

## 2017-01-10 LAB — POCT I-STAT 3, ART BLOOD GAS (G3+)
ACID-BASE DEFICIT: 15 mmol/L — AB (ref 0.0–2.0)
Acid-base deficit: 12 mmol/L — ABNORMAL HIGH (ref 0.0–2.0)
Acid-base deficit: 14 mmol/L — ABNORMAL HIGH (ref 0.0–2.0)
Acid-base deficit: 12 mmol/L — ABNORMAL HIGH (ref 0.0–2.0)
Bicarbonate: 16.3 mmol/L — ABNORMAL LOW (ref 20.0–28.0)
Bicarbonate: 17.3 mmol/L — ABNORMAL LOW (ref 20.0–28.0)
Bicarbonate: 17.7 mmol/L — ABNORMAL LOW (ref 20.0–28.0)
Bicarbonate: 17.5 mmol/L — ABNORMAL LOW (ref 20.0–28.0)
O2 SAT: 100 %
O2 SAT: 82 %
O2 Saturation: 94 %
O2 Saturation: 94 %
PCO2 ART: 56.3 mmHg — AB (ref 32.0–48.0)
PCO2 ART: 47.3 mmHg (ref 32.0–48.0)
PCO2 ART: 57 mmHg — AB (ref 32.0–48.0)
PH ART: 7.049 — AB (ref 7.350–7.450)
PH ART: 7.076 — AB (ref 7.350–7.450)
PH ART: 7.145 — AB (ref 7.350–7.450)
PH ART: 7.096 — AB (ref 7.350–7.450)
PO2 ART: 243 mmHg — AB (ref 83.0–108.0)
PO2 ART: 96 mmHg (ref 83.0–108.0)
Patient temperature: 32.6
TCO2: 18 mmol/L — AB (ref 22–32)
TCO2: 19 mmol/L — AB (ref 22–32)
TCO2: 20 mmol/L — AB (ref 22–32)
TCO2: 19 mmol/L — ABNORMAL LOW (ref 22–32)
pCO2 arterial: 56.2 mmHg — ABNORMAL HIGH (ref 32.0–48.0)
pO2, Arterial: 52 mmHg — ABNORMAL LOW (ref 83.0–108.0)
pO2, Arterial: 76 mmHg — ABNORMAL LOW (ref 83.0–108.0)

## 2017-01-10 LAB — POCT I-STAT, CHEM 8
BUN: 33 mg/dL — ABNORMAL HIGH (ref 6–20)
CREATININE: 1.5 mg/dL — AB (ref 0.44–1.00)
Calcium, Ion: 1.19 mmol/L (ref 1.15–1.40)
Chloride: 105 mmol/L (ref 101–111)
Glucose, Bld: 299 mg/dL — ABNORMAL HIGH (ref 65–99)
HCT: 39 % (ref 36.0–46.0)
HEMOGLOBIN: 13.3 g/dL (ref 12.0–15.0)
POTASSIUM: 2.7 mmol/L — AB (ref 3.5–5.1)
Sodium: 143 mmol/L (ref 135–145)
TCO2: 21 mmol/L — ABNORMAL LOW (ref 22–32)

## 2017-01-10 LAB — BASIC METABOLIC PANEL
ANION GAP: 20 — AB (ref 5–15)
Anion gap: 14 (ref 5–15)
BUN: 34 mg/dL — AB (ref 6–20)
BUN: 33 mg/dL — AB (ref 6–20)
CO2: 15 mmol/L — ABNORMAL LOW (ref 22–32)
CO2: 19 mmol/L — ABNORMAL LOW (ref 22–32)
Calcium: 8.3 mg/dL — ABNORMAL LOW (ref 8.9–10.3)
Calcium: 7.8 mg/dL — ABNORMAL LOW (ref 8.9–10.3)
Chloride: 108 mmol/L (ref 101–111)
Chloride: 110 mmol/L (ref 101–111)
Creatinine, Ser: 2.06 mg/dL — ABNORMAL HIGH (ref 0.44–1.00)
Creatinine, Ser: 1.95 mg/dL — ABNORMAL HIGH (ref 0.44–1.00)
GFR, EST AFRICAN AMERICAN: 24 mL/min — AB (ref >60)
GFR, EST AFRICAN AMERICAN: 26 mL/min — AB (ref >60)
GFR, EST NON AFRICAN AMERICAN: 21 mL/min — AB (ref >60)
GFR, EST NON AFRICAN AMERICAN: 22 mL/min — AB (ref >60)
Glucose, Bld: 310 mg/dL — ABNORMAL HIGH (ref 65–99)
Glucose, Bld: 300 mg/dL — ABNORMAL HIGH (ref 65–99)
POTASSIUM: 2.7 mmol/L — AB (ref 3.5–5.1)
POTASSIUM: 2.8 mmol/L — AB (ref 3.5–5.1)
SODIUM: 143 mmol/L (ref 135–145)
SODIUM: 143 mmol/L (ref 135–145)

## 2017-01-10 LAB — MRSA PCR SCREENING: MRSA by PCR: NEGATIVE

## 2017-01-10 LAB — PROTIME-INR
INR: 1.66
PROTHROMBIN TIME: 19.5 s — AB (ref 11.4–15.2)

## 2017-01-10 LAB — PROCALCITONIN: PROCALCITONIN: 8.24 ng/mL

## 2017-01-10 LAB — GLUCOSE, CAPILLARY: Glucose-Capillary: 272 mg/dL — ABNORMAL HIGH (ref 65–99)

## 2017-01-10 LAB — TROPONIN I: TROPONIN I: 0.24 ng/mL — AB (ref <0.03)

## 2017-01-10 LAB — LACTIC ACID, PLASMA: Lactic Acid, Venous: 10.1 mmol/L (ref 0.5–1.9)

## 2017-01-10 LAB — APTT: aPTT: 40 seconds — ABNORMAL HIGH (ref 24–36)

## 2017-01-10 MED ORDER — POTASSIUM CHLORIDE 10 MEQ/50ML IV SOLN
10.0000 meq | INTRAVENOUS | Status: AC
  Administered 2017-01-10 (×2): 10 meq via INTRAVENOUS
  Filled 2017-01-10: qty 50

## 2017-01-10 MED ORDER — CISATRACURIUM BOLUS VIA INFUSION
0.0500 mg/kg | INTRAVENOUS | Status: DC | PRN
  Filled 2017-01-10: qty 3

## 2017-01-10 MED ORDER — PROPOFOL 1000 MG/100ML IV EMUL
25.0000 ug/kg/min | INTRAVENOUS | Status: DC
  Administered 2017-01-10: 25 ug/kg/min via INTRAVENOUS
  Filled 2017-01-10: qty 100

## 2017-01-10 MED ORDER — ASPIRIN 300 MG RE SUPP: 300.0000 mg | RECTAL | Status: DC

## 2017-01-10 MED ORDER — SODIUM CHLORIDE 0.9 % IV SOLN
INTRAVENOUS | Status: DC
  Administered 2017-01-10: 10:00:00 via INTRAVENOUS

## 2017-01-10 MED ORDER — FENTANYL CITRATE (PF) 100 MCG/2ML IJ SOLN
50.0000 ug | Freq: Once | INTRAMUSCULAR | Status: AC
  Administered 2017-01-10: 50 ug via INTRAVENOUS

## 2017-01-10 MED ORDER — FENTANYL 2500MCG IN NS 250ML (10MCG/ML) PREMIX INFUSION
100.0000 ug/h | INTRAVENOUS | Status: DC
  Administered 2017-01-10: 100 ug/h via INTRAVENOUS
  Filled 2017-01-10: qty 250

## 2017-01-10 MED ORDER — CHLORHEXIDINE GLUCONATE 0.12% ORAL RINSE (MEDLINE KIT)
15.0000 mL | Freq: Two times a day (BID) | OROMUCOSAL | Status: DC
  Administered 2017-01-10: 15 mL via OROMUCOSAL

## 2017-01-10 MED ORDER — NOREPINEPHRINE BITARTRATE 1 MG/ML IV SOLN
0.0000 ug/min | INTRAVENOUS | Status: DC
  Administered 2017-01-10: 100 ug/min via INTRAVENOUS
  Administered 2017-01-10: 50 ug/min via INTRAVENOUS
  Filled 2017-01-10 (×3): qty 16

## 2017-01-10 MED ORDER — CISATRACURIUM BOLUS VIA INFUSION
0.1000 mg/kg | Freq: Once | INTRAVENOUS | Status: AC
  Administered 2017-01-10: 5.7 mg via INTRAVENOUS
  Filled 2017-01-10: qty 6

## 2017-01-10 MED ORDER — PANTOPRAZOLE SODIUM 40 MG IV SOLR
40.0000 mg | Freq: Every day | INTRAVENOUS | Status: DC
  Administered 2017-01-10: 40 mg via INTRAVENOUS
  Filled 2017-01-10: qty 40

## 2017-01-10 MED ORDER — MORPHINE SULFATE 25 MG/ML IV SOLN
5.0000 mg/h | INTRAVENOUS | Status: DC
  Administered 2017-01-10: 5 mg/h via INTRAVENOUS
  Filled 2017-01-10: qty 10

## 2017-01-10 MED ORDER — SODIUM CHLORIDE 0.9 % IV SOLN
1.0000 ug/kg/min | INTRAVENOUS | Status: DC
  Administered 2017-01-10 (×2): 1 ug/kg/min via INTRAVENOUS
  Filled 2017-01-10: qty 20

## 2017-01-10 MED ORDER — ENOXAPARIN SODIUM 30 MG/0.3ML ~~LOC~~ SOLN: 30.0000 mg | SUBCUTANEOUS | Status: DC

## 2017-01-10 MED ORDER — METRONIDAZOLE IN NACL 5-0.79 MG/ML-% IV SOLN
500.0000 mg | Freq: Three times a day (TID) | INTRAVENOUS | Status: DC
  Administered 2017-01-10: 500 mg via INTRAVENOUS
  Filled 2017-01-10: qty 100

## 2017-01-10 MED ORDER — HYDROCORTISONE NA SUCCINATE PF 100 MG IJ SOLR
50.0000 mg | Freq: Four times a day (QID) | INTRAMUSCULAR | Status: DC
  Administered 2017-01-10: 50 mg via INTRAVENOUS
  Filled 2017-01-10: qty 2

## 2017-01-10 MED ORDER — SODIUM CHLORIDE 0.9 % IV SOLN
0.0000 ug/min | INTRAVENOUS | Status: DC
  Administered 2017-01-10: 30 ug/min via INTRAVENOUS
  Administered 2017-01-10: 300 ug/min via INTRAVENOUS
  Filled 2017-01-10 (×3): qty 1

## 2017-01-10 MED ORDER — VASOPRESSIN 20 UNIT/ML IV SOLN
0.0300 [IU]/min | INTRAVENOUS | Status: DC
  Administered 2017-01-10: 0.03 [IU]/min via INTRAVENOUS
  Filled 2017-01-10 (×3): qty 2

## 2017-01-10 MED ORDER — FENTANYL BOLUS VIA INFUSION
25.0000 ug | INTRAVENOUS | Status: DC | PRN
  Filled 2017-01-10: qty 25

## 2017-01-10 MED ORDER — ARTIFICIAL TEARS OPHTHALMIC OINT
1.0000 "application " | TOPICAL_OINTMENT | Freq: Three times a day (TID) | OPHTHALMIC | Status: DC
  Administered 2017-01-10: 1 via OPHTHALMIC
  Filled 2017-01-10: qty 3.5

## 2017-01-10 MED ORDER — NOREPINEPHRINE BITARTRATE 1 MG/ML IV SOLN
0.0000 ug/min | INTRAVENOUS | Status: DC
  Administered 2017-01-10: 40 ug/min via INTRAVENOUS
  Filled 2017-01-10: qty 4

## 2017-01-10 MED ORDER — DEXTROSE 5 % IV SOLN
2.0000 g | INTRAVENOUS | Status: DC
  Administered 2017-01-10: 2 g via INTRAVENOUS
  Filled 2017-01-10: qty 2

## 2017-01-10 MED ORDER — SODIUM BICARBONATE 8.4 % IV SOLN
INTRAVENOUS | Status: AC
  Administered 2017-01-10: 50 meq
  Filled 2017-01-10: qty 100

## 2017-01-10 MED ORDER — ORAL CARE MOUTH RINSE
15.0000 mL | Freq: Four times a day (QID) | OROMUCOSAL | Status: DC
  Administered 2017-01-10: 15 mL via OROMUCOSAL

## 2017-01-10 MED ORDER — SODIUM BICARBONATE 8.4 % IV SOLN
INTRAVENOUS | Status: DC
  Administered 2017-01-10: 12:00:00 via INTRAVENOUS
  Filled 2017-01-10 (×4): qty 150

## 2017-01-10 MED ORDER — INSULIN ASPART 100 UNIT/ML ~~LOC~~ SOLN
0.0000 [IU] | SUBCUTANEOUS | Status: DC
  Administered 2017-01-10: 8 [IU] via SUBCUTANEOUS

## 2017-01-10 MED ORDER — SODIUM CHLORIDE 0.9 % IV SOLN
0.0000 ug/min | INTRAVENOUS | Status: DC
  Administered 2017-01-10: 20 ug/min via INTRAVENOUS
  Filled 2017-01-10: qty 4

## 2017-01-10 MED FILL — Medication: Qty: 1 | Status: AC

## 2017-01-12 LAB — CULTURE, RESPIRATORY

## 2017-01-12 LAB — CULTURE, BAL-QUANTITATIVE

## 2017-01-12 LAB — CULTURE, BAL-QUANTITATIVE W GRAM STAIN

## 2017-01-12 LAB — CULTURE, RESPIRATORY W GRAM STAIN

## 2017-01-15 LAB — CULTURE, BLOOD (ROUTINE X 2)
Culture: NO GROWTH
Culture: NO GROWTH
SPECIAL REQUESTS: ADEQUATE
Special Requests: ADEQUATE

## 2017-01-21 NOTE — Progress Notes (Signed)
At approximately 7:54 I was called into room by Mattie MarlinJessica Focht, PA for assistance.  The patient was lying on her left sided, had vomited brown substance on the right and left side of the bed, and was Agonal breathing.  Pt was placed on her back, she was unresponsive, eyes fixed and dilated, and then no longer breathing.  CPR was started, and CODE was called on patient at [id number].

## 2017-01-21 NOTE — Procedures (Signed)
Central Venous Catheter Insertion Procedure Note Jamie DesanctisSarah Raymond [id number] [birth year]  Procedure: Insertion of Central Venous Catheter Indications: Assessment of intravascular volume, Drug and/or fluid administration and Frequent blood sampling  Procedure Details Consent: Risks of procedure as well as the alternatives and risks of each were explained to the (patient/caregiver).  Consent for procedure obtained. Time Out: Verified patient identification, verified procedure, site/side was marked, verified correct patient position, special equipment/implants available, medications/allergies/relevent history reviewed, required imaging and test results available.  Performed  Maximum sterile technique was used including antiseptics, cap, gloves, gown, hand hygiene, mask and sheet. Skin prep: Chlorhexidine; local anesthetic administered A antimicrobial bonded/coated triple lumen catheter was placed in the left subclavian vein using the Seldinger technique.  Evaluation Blood flow good Complications: No apparent complications Patient did tolerate procedure well. Chest X-ray ordered to verify placement.  CXR: normal.  U/S used in placement  Jamie Raymond 01/02/2017, 11:39 AM

## 2017-01-21 NOTE — Progress Notes (Addendum)
Time of death 15:55. Verified by Delories HeinzMelissa Juhi Lagrange, RN and  Dessa PhiShane Taylor, RN. Janee Mornhompson, MD notified.  Delories HeinzMelissa Yago Ludvigsen, RN

## 2017-01-21 NOTE — Progress Notes (Signed)
Chaplain paged to [phone] for a code this morning.  Patient was unresponsive. Admitted for a rib fracture.  Family was notified and came to hospital room.  Patient was transferred to [facility]H13.  Chaplain walked family to [facility] waiting room.  Chaplain prayed and offered support for family and during shift change, advised a chaplain should go and sit with family.        Hillery JacksLisa Josel Keo 2016/07/23

## 2017-01-21 NOTE — Progress Notes (Signed)
Patient ID: Jamie Raymond, female   DOB: 04/12/1929, 83 y.o.   MRN: [id number] She remains hypotensive and acidotic despite all aggressive efforts. Dr. Nelda Marseille and I met with her family. They want to proceed with comfort measures and this is very appropriate at this time. Will start morphine drip and D/C pressors. DNR ordered.  Georganna Skeans, MD, MPH, FACS Trauma: [phone] General Surgery: [phone]

## 2017-01-21 NOTE — Progress Notes (Signed)
Changes made per CCM: RR 30, VT 280 (6 cc's per kg), FIO2 90%, and PEEP of 12 to align with ARDS protocol. RT will draw ABG in 30 minutes. RT will continue to monitor.

## 2017-01-21 NOTE — Progress Notes (Signed)
Patient ID: Jamie DesanctisSarah Raymond, female   DOB: 01/20/1930, 83 y.o.   MRN: [id number] I updated her husband and daughter. Violeta GelinasBurke Jervis Trapani, MD, MPH, FACS Trauma: [phone] General Surgery: [phone]

## 2017-01-21 NOTE — Procedures (Signed)
Intubation Procedure Note Jamie DesanctisSarah Raymond [id number] February 20, 1930  Procedure: Intubation Indications: change out initial ETT d/t vomit/bowel debris in ETT  Procedure Details Consent: MD discussed w/ family- Dr Jamie Raymond Time Out: Verified patient identification, verified procedure, site/side was marked, verified correct patient position, special equipment/implants available, medications/allergies/relevent history reviewed, required imaging and test results available.  Performed  Maximum sterile technique was used including gloves, hand hygiene and mask.  MD used ETT tube changer    Evaluation Hemodynamic Status: BP stable throughout; O2 sats: stable throughout Patient's Current Condition: stable Complications: No apparent complications Patient did tolerate procedure well. Chest X-ray ordered to verify placement.  CXR: pending.   Jamie Raymond, Jamie Raymond 01/11/2017

## 2017-01-21 NOTE — Progress Notes (Signed)
Central line access verified by Molli KnockYacoub and xray

## 2017-01-21 NOTE — Progress Notes (Signed)
Stopped through and met family in the waiting room for this patient.  Patient suddenly experienced cardiac arrest this morning which was a total shock to the family.  Family asked for prayers.  Prayers, compassionate listening and presence provided to this family.    01/17/17 1024  Clinical Encounter Type  Visited With Family;Patient  Visit Type Follow-up;Spiritual support

## 2017-01-21 NOTE — Code Documentation (Addendum)
  Patient Name: Jamie DesanctisSarah Raymond   MRN: [id number]   Date of Birth/ Sex: Nov 30, 1929 , female      Admission Date: Apr 01, 2016  Attending Provider: Md, Trauma, MD  Primary Diagnosis: <principal problem not specified>   Indication: Pt was in her usual state of health until this 7:55 AM, when she was noted to have aspirated the vomiting and noted to be in PEA with no pulse.  Code blue was subsequently called. At the time of arrival on scene, ACLS protocol was underway.   Technical Description:  - CPR performance duration:  20  minutes  - Was defibrillation or cardioversion used? No   - Was external pacer placed? No  - Was patient intubated pre/post CPR? Yes   Medications Administered: Y = Yes; Blank = No Amiodarone  y  Atropine    Calcium  y  Epinephrine  y  Lidocaine    Magnesium    Norepinephrine  y- drip  Phenylephrine    Sodium bicarbonate  y  Vasopressin     Post CPR evaluation:  - Final Status - Was patient successfully resuscitated ? Yes - What is current rhythm? Normal sinus rhythym - What is current hemodynamic status? Hypotensive on levophed   Miscellaneous Information:  - Labs sent, including: no  - Primary team notified?  Yes  - Family Notified? Yes  - Additional notes/ transfer status: Transferred to 2 H- Dr Molli KnockYacoub personally notified. Dr Janee Mornhompson- the primary surgeon was present during the majority of the code, as well as Dr. Heide SparkNarendra who assisted with running the code.      Jamie Raymond, Jamie Santori, MD  12/23/2016, 9:45 AM

## 2017-01-21 NOTE — Progress Notes (Signed)
RR increased to 35 and VT 370 (8 cc's per kg) per MD Dr. Molli KnockYacoub based on ABG results. Repeat ABG to be done in 30 minutes.

## 2017-01-21 NOTE — Progress Notes (Signed)
CRITICAL VALUE ALERT  Critical Value:  Potassium 2.7 AND Hypotension  Date & Time Notied:  01/14/2017 1258  Provider Notified: Molli KnockYacoub, MD  Orders Received/Actions taken: MD verbal order for 4 runs of Potassium IV, with recheck 1 hour post last run. Will administer and monitor closely  Delories HeinzMelissa Sheina Mcleish, RN

## 2017-01-21 NOTE — Progress Notes (Signed)
Family asked to speak with me, after discussing the whole case decision was made to proceed with cooling but no CPR in case or arrest and family will discuss level of function that would be appropriate after hypothermia protocol is complete pending neuro status.  The patient is critically ill with multiple organ systems failure and requires high complexity decision making for assessment and support, frequent evaluation and titration of therapies, application of advanced monitoring technologies and extensive interpretation of multiple databases.   Critical Care Time devoted to patient care services described in this note is  35  Minutes. This time reflects time of care of this signee Dr Koren BoundWesam Yacoub. This critical care time does not reflect procedure time, or teaching time or supervisory time of PA/NP/Med student/Med Resident etc but could involve care discussion time.  Alyson ReedyWesam G. Yacoub, M.D. [facility]. Pager: [phone]. After hours pager: [phone].

## 2017-01-21 NOTE — Procedures (Signed)
Arterial Catheter Insertion Procedure Note Claudia DesanctisSarah Molinaro [id number] 15-Mar-1929  Procedure: Insertion of Arterial Catheter  Indications: Blood pressure monitoring and Frequent blood sampling  Procedure Details Consent: Risks of procedure as well as the alternatives and risks of each were explained to the (patient/caregiver).  Consent for procedure obtained. Time Out: Verified patient identification, verified procedure, site/side was marked, verified correct patient position, special equipment/implants available, medications/allergies/relevent history reviewed, required imaging and test results available.  Performed  Maximum sterile technique was used including antiseptics, cap, gloves, gown, hand hygiene, mask and sheet. Skin prep: Chlorhexidine; local anesthetic administered 20 gauge catheter was inserted into left radial artery using the Seldinger technique.  Evaluation Blood flow good; BP tracing good. Complications: No apparent complications.   YACOUB,WESAM 01/05/2017

## 2017-01-21 NOTE — Consult Note (Signed)
Cardiology Consultation:   Patient ID: Jamie Raymond; [id number]; [birth year]   Admit date: 01/07/2017 Date of Consult: 01-25-17  Primary Care Provider: Patient, No Pcp Per Primary Cardiologist: new - Dr. Maham Quintin Salvia Primary Electrophysiologist:     Patient Profile:   Jamie Raymond is a 83 y.o. female with a hx of osteoporosis in the hospital following a fall with multiple rib fractures who is being seen today for the evaluation of PEA arrest, Vtach, and hypotension at the request of Dr. Janee Morn.  History of Present Illness:   Ms. Hammac was hospitalized on 12/29/2016 following a fall and found to have multiple rib fractures. This morning, primary team found that she had vomited copious amounts and dark emesis was coming from mouth and nares. Pt was found to be in a PEA arrest and CPR was started and she received one dose of epinephrine. She was coded for approximately 15-20 min. She converted to Vtach and 300 mg amiodarone bolus was given with conversion to NSR. IVF and levophed were started. She is hypotensive in the 80s. She was transferred to [facility] and cardiology was consulted.   Level 5 Caveat - pt is intubated  She is now in NSR. She does not have a cardiac history in Epic or Care Everywhere.    Past Medical History:  Diagnosis Date  . Arthritis   . Osteoporosis   . Scoliosis     Past Surgical History:  Procedure Laterality Date  . ABDOMINAL HYSTERECTOMY       Home Medications:  Prior to Admission medications   Medication Sig Start Date End Date Taking? Authorizing Provider  fluticasone (FLONASE) 50 MCG/ACT nasal spray Place 1-2 sprays daily into both nostrils. 12/14/16  Yes [provider]  lisinopril (PRINIVIL,ZESTRIL) 10 MG tablet Take 10 mg daily by mouth. 10/27/16  Yes [provider]  pravastatin (PRAVACHOL) 40 MG tablet Take 40 mg daily by mouth. 12/03/16  Yes [provider]    Inpatient Medications: Scheduled Meds: . acetaminophen  1,000  mg Oral Q8H  . enoxaparin (LOVENOX) injection  40 mg Subcutaneous Q24H  . fluticasone  1-2 spray Each Nare Daily  . lisinopril  10 mg Oral Daily  . pantoprazole  40 mg Oral Daily  . pravastatin  40 mg Oral Daily  . sodium bicarbonate       Continuous Infusions: . sodium chloride 50 mL/hr at 01/09/17 2233   PRN Meds: calcium carbonate, HYDROmorphone (DILAUDID) injection, ibuprofen, methocarbamol, ondansetron **OR** ondansetron (ZOFRAN) IV, oxyCODONE, prochlorperazine **OR** prochlorperazine  Allergies:    Allergies  Allergen Reactions  . Amoxicillin Hives and Rash    Details unknown to family  . Penicillins Hives and Rash    Details unknown to family    Social History:   Social History   Socioeconomic History  . Marital status: Married    Spouse name: Not on file  . Number of children: Not on file  . Years of education: Not on file  . Highest education level: Not on file  Social Needs  . Financial resource strain: Not on file  . Food insecurity - worry: Not on file  . Food insecurity - inability: Not on file  . Transportation needs - medical: Not on file  . Transportation needs - non-medical: Not on file  Occupational History  . Not on file  Tobacco Use  . Smoking status: Never Smoker  . Smokeless tobacco: Never Used  Substance and Sexual Activity  . Alcohol use: No    Frequency: Never  .  Drug use: No  . Sexual activity: Not on file  Other Topics Concern  . Not on file  Social History Narrative  . Not on file    Family History:   Family history unable to be gathered 2/ patient is intubated. Both parents are deceased.  ROS:  Please see the history of present illness.  ROS  All other ROS reviewed and negative.     Physical Exam/Data:   Vitals:   01/09/17 1012 01/09/17 1524 01/09/17 2100 January 16, 2017 0429  BP: 113/65 118/60 138/66 (!) 98/51  Pulse: 74 98 64 98  Resp: 18 16 18 18   Temp: 98.3 F (36.8 C) 98.4 F (36.9 C) (!) 97.4 F (36.3 C) 97.7 F (36.5  C)  TempSrc: Oral Oral Oral Oral  SpO2: 99% 97% 96% 94%  Weight:      Height:        Intake/Output Summary (Last 24 hours) at 01/16/17 0850 Last data filed at 2017/01/16 0255 Gross per 24 hour  Intake 1971.66 ml  Output 800 ml  Net 1171.66 ml   Filed Weights   01/01/2017 1746  Weight: 125 lb (56.7 kg)   Body mass index is 24.41 kg/m.  General:  intubated Vascular: No carotid bruits Cardiac:  normal S1, S2; RRR; no murmur Lungs:  Intubated, coarse sounds throughout Abd: soft, nontender, no hepatomegaly  Ext: no edema Musculoskeletal:  No deformities Skin: warm and dry  Neuro:  intubated Psych:  sedated   EKG:  The EKG was personally reviewed and demonstrates:  pending Telemetry:  Telemetry was personally reviewed and demonstrates:  Sinus   Relevant CV Studies:   none  Laboratory Data:  Chemistry Recent Labs  Lab 01/09/2017 1858 01/09/17 0444  NA 139 139  K 4.4 4.5  CL 104 105  CO2 26 26  GLUCOSE 134* 124*  BUN 26* 21*  CREATININE 0.86 0.87  CALCIUM 10.3 9.6  GFRNONAA 60* 59*  GFRAA >60 >60  ANIONGAP 9 8    No results for input(s): PROT, ALBUMIN, AST, ALT, ALKPHOS, BILITOT in the last 168 hours. Hematology Recent Labs  Lab 01/11/2017 1858 01/09/17 0444  WBC 11.3* 9.9  RBC 4.31 3.92  HGB 13.3 12.4  HCT 40.3 37.2  MCV 93.5 94.9  MCH 30.9 31.6  MCHC 33.0 33.3  RDW 13.9 14.3  PLT 221 200   Cardiac EnzymesNo results for input(s): TROPONINI in the last 168 hours. No results for input(s): TROPIPOC in the last 168 hours.  BNPNo results for input(s): BNP, PROBNP in the last 168 hours.  DDimer No results for input(s): DDIMER in the last 168 hours.  Radiology/Studies:  Dg Chest 2 View  Result Date: 01/09/2017 CLINICAL DATA:  Multiple right rib fractures. EXAM: CHEST  2 VIEW COMPARISON:  01/11/2017 FINDINGS: Heart is borderline in size. Diffuse aortic calcifications. Lungs are clear. No effusions or acute bony abnormality. IMPRESSION: Borderline heart  size.  No active disease. Aortic atherosclerosis. Electronically Signed   By: Charlett Nose M.D.   On: 01/09/2017 15:20   Ct Chest W Contrast  Result Date: 12/23/2016 CLINICAL DATA:  Patient fell from sitting on the side of the tub. Struck the right side. Pain along the right costal margin. EXAM: CT CHEST, ABDOMEN, AND PELVIS WITH CONTRAST TECHNIQUE: Multidetector CT imaging of the chest, abdomen and pelvis was performed following the standard protocol during bolus administration of intravenous contrast. CONTRAST:  ISOVUE-300 IOPAMIDOL (ISOVUE-300) INJECTION 61% COMPARISON:  None. FINDINGS: CT CHEST FINDINGS Cardiovascular: Normal heart size.  No pericardial effusion. Coronary artery and aortic valve calcifications. Calcification of the aorta. No aneurysm or dissection. Mediastinum/Nodes: Esophagus is decompressed. No abnormal mediastinal gas or fluid collections. No significant lymphadenopathy in the chest. Lungs/Pleura: Small pleural thickening or effusion on the right. Atelectasis in both lung bases. No focal consolidation. No pneumothorax. Airways are patent. Musculoskeletal: Degenerative changes throughout the thoracic spine. No vertebral compression deformities. No sternal depression. Acute comminuted and depressed fractures are demonstrated to the right posterior 11, 10, 9, 8, 7, and sixth ribs. Soft tissue hematoma in the paraspinal spaces. Degenerative changes in the shoulders. No acute left rib fractures. CT ABDOMEN PELVIS FINDINGS Hepatobiliary: Mild diffuse fatty infiltration of the liver. No focal liver lesions identified. Gallbladder and bile ducts are unremarkable. Pancreas: Unremarkable. No pancreatic ductal dilatation or surrounding inflammatory changes. Spleen: No splenic injury or perisplenic hematoma. Adrenals/Urinary Tract: No adrenal hemorrhage or renal injury identified. Parapelvic cysts on the left kidney. Bladder is unremarkable. Stomach/Bowel: Lower esophagus and stomach are  unremarkable. Mildly dilated predominantly fluid-filled mid abdominal small bowel. Distal small bowel are relatively decompressed. Transition zone appears to be in the right lower quadrant. No masses identified. Likely this indicates a adhesions. No wall thickening is appreciated. Appendix is not identified. Colon is nondistended and stool-filled. Diverticulosis of the sigmoid colon without evidence of diverticulitis. Vascular/Lymphatic: Aortic atherosclerosis. No enlarged abdominal or pelvic lymph nodes. Reproductive: Status post hysterectomy. No adnexal masses. Other: No free air or free fluid in the abdomen. Abdominal wall musculature appears intact. Musculoskeletal: Degenerative changes in the spine and hips. Spondylolysis with mild spondylolisthesis at L5-S1. No acute displaced fractures identified. IMPRESSION: 1. Multiple acute comminuted and depressed right posterior rib fractures. Associated chest wall hematoma, small right pleural effusion, and atelectasis in the lung bases. 2. No acute mediastinal injury is demonstrated. 3. No acute posttraumatic changes demonstrated in the abdomen or pelvis. No solid organ injury or bowel perforation. 4. Changes of small bowel obstruction, transition zone in the right lower quadrant. Likely etiology is adhesion. 5. Aortic atherosclerosis. 6. Spondylolysis with mild spondylolisthesis at L5-S1. Electronically Signed   By: Burman NievesWilliam  Stevens M.D.   On: 12/28/2016 22:48   Ct Abdomen Pelvis W Contrast  Result Date: 12/23/2016 CLINICAL DATA:  Patient fell from sitting on the side of the tub. Struck the right side. Pain along the right costal margin. EXAM: CT CHEST, ABDOMEN, AND PELVIS WITH CONTRAST TECHNIQUE: Multidetector CT imaging of the chest, abdomen and pelvis was performed following the standard protocol during bolus administration of intravenous contrast. CONTRAST:  100mL ISOVUE-300 IOPAMIDOL (ISOVUE-300) INJECTION 61% COMPARISON:  None. FINDINGS: CT CHEST FINDINGS  Cardiovascular: Normal heart size. No pericardial effusion. Coronary artery and aortic valve calcifications. Calcification of the aorta. No aneurysm or dissection. Mediastinum/Nodes: Esophagus is decompressed. No abnormal mediastinal gas or fluid collections. No significant lymphadenopathy in the chest. Lungs/Pleura: Small pleural thickening or effusion on the right. Atelectasis in both lung bases. No focal consolidation. No pneumothorax. Airways are patent. Musculoskeletal: Degenerative changes throughout the thoracic spine. No vertebral compression deformities. No sternal depression. Acute comminuted and depressed fractures are demonstrated to the right posterior 11, 10, 9, 8, 7, and sixth ribs. Soft tissue hematoma in the paraspinal spaces. Degenerative changes in the shoulders. No acute left rib fractures. CT ABDOMEN PELVIS FINDINGS Hepatobiliary: Mild diffuse fatty infiltration of the liver. No focal liver lesions identified. Gallbladder and bile ducts are unremarkable. Pancreas: Unremarkable. No pancreatic ductal dilatation or surrounding inflammatory changes. Spleen: No splenic injury or perisplenic hematoma. Adrenals/Urinary  Tract: No adrenal hemorrhage or renal injury identified. Parapelvic cysts on the left kidney. Bladder is unremarkable. Stomach/Bowel: Lower esophagus and stomach are unremarkable. Mildly dilated predominantly fluid-filled mid abdominal small bowel. Distal small bowel are relatively decompressed. Transition zone appears to be in the right lower quadrant. No masses identified. Likely this indicates a adhesions. No wall thickening is appreciated. Appendix is not identified. Colon is nondistended and stool-filled. Diverticulosis of the sigmoid colon without evidence of diverticulitis. Vascular/Lymphatic: Aortic atherosclerosis. No enlarged abdominal or pelvic lymph nodes. Reproductive: Status post hysterectomy. No adnexal masses. Other: No free air or free fluid in the abdomen. Abdominal  wall musculature appears intact. Musculoskeletal: Degenerative changes in the spine and hips. Spondylolysis with mild spondylolisthesis at L5-S1. No acute displaced fractures identified. IMPRESSION: 1. Multiple acute comminuted and depressed right posterior rib fractures. Associated chest wall hematoma, small right pleural effusion, and atelectasis in the lung bases. 2. No acute mediastinal injury is demonstrated. 3. No acute posttraumatic changes demonstrated in the abdomen or pelvis. No solid organ injury or bowel perforation. 4. Changes of small bowel obstruction, transition zone in the right lower quadrant. Likely etiology is adhesion. 5. Aortic atherosclerosis. 6. Spondylolysis with mild spondylolisthesis at L5-S1. Electronically Signed   By: Burman NievesWilliam  Stevens M.D.   On: 01/15/2017 22:48    Assessment and Plan:   1. PEA arrest, Vtach, now NSR - trauma service rounded this morning and found pt with copious amounts of vomiting - she had a PEA arrest with CPR, Vtach, and ROSC - she is now in NSR and hypotensive - IVF and levophed running for pressure support - respiratory status per critical care team    For questions or updates, please contact [facility] Please consult [email or web address] for contact info under Cardiology/STEMI.   Signed, Roe Rutherfordngela Nicole Serenitie Vinton, PA  01/20/17 8:50 AM

## 2017-01-21 NOTE — Progress Notes (Signed)
230ml of morphine gtt wasted in sink. Witnessed by Delories HeinzMelissa Garstka RN and Oren BracketAmanda Muzammil Bruins RN

## 2017-01-21 NOTE — Progress Notes (Signed)
RR increased to 30 and FIO2 lowered to 50% per MD Dr. Molli KnockYacoub based on ABG results.

## 2017-01-21 NOTE — Procedures (Signed)
Intubation Procedure Note Dyneisha Murchison [id number] [birth year]  Procedure: Intubation Indications: Respiratory insufficiency  Procedure Details Consent: Risks of procedure as well as the alternatives and risks of each were explained to the (patient/caregiver).  Consent for procedure obtained. Time Out: Verified patient identification, verified procedure, site/side was marked, verified correct patient position, special equipment/implants available, medications/allergies/relevent history reviewed, required imaging and test results available.  Performed  Maximum sterile technique was used including antiseptics, gloves, hand hygiene and mask.  MAC    Evaluation Hemodynamic Status: BP stable throughout; O2 sats: stable throughout Patient's Current Condition: stable Complications: No apparent complications Patient did tolerate procedure well. Chest X-ray ordered to verify placement.  CXR: pending.   YACOUB,WESAM 01-Feb-2017

## 2017-01-21 NOTE — Procedures (Signed)
Extubation Procedure Note  Patient Details:   Name: Jamie DesanctisSarah Raymond DOB: Mar 02, 1929 MRN: [id number]   Airway Documentation:  Airway 7.5 mm (Active)  Secured at (cm) 22 cm September 09, 2016 11:56 AM  Measured From Lips September 09, 2016 11:56 AM  Secured Location Right September 09, 2016 11:56 AM  Secured By [employer] Tube Holder September 09, 2016 11:56 AM  Tube Holder Repositioned Yes September 09, 2016 11:56 AM  Cuff Pressure (cm H2O) 30 cm H2O September 09, 2016 11:56 AM  Site Condition Dry September 09, 2016 11:56 AM    Evaluation  O2 sats: transiently fell during during procedure Complications: Complications of withdrawel of life sustaining measures Patient did tolerate procedure well. Bilateral Breath Sounds: Clear, Diminished   No   Patient extubated per order and family wishes to withdraw care. Family is at bedside along with RN.   Tyja Gortney Lajuana RippleM Dawnyel Leven September 09, 2016, 3:43 PM

## 2017-01-21 NOTE — Progress Notes (Signed)
Patient ID: Jamie DesanctisSarah Lindfors, female   DOB: [birth year], 83 y.o.   MRN: [id number] Notified patient was coding from possible aspiration related cardiac arrest. On my arrival she was in V tach with a pulse. She had been intubated. Unresponsive, lungs coarse B, CV 190s, palp femoral pulse.Amio was given and her BP was low. Levophed started. Transfer to ICU. Cardiology consult. Will consult CCM as well with in hospital V tach arrest. I spoke with her husband and daughter. Violeta GelinasBurke Eddis Pingleton, MD, MPH, FACS Trauma: [phone] General Surgery: [phone]

## 2017-01-21 NOTE — Procedures (Signed)
Chest Tube Insertion Procedure Note  Pre-operative Diagnosis: R pneumothorax  Post-operative Diagnosis: R pneumothorax  Procedure Details  Informed consent was obtained for the procedure, including sedation.  Risks of lung perforation, hemorrhage, arrhythmia, and adverse drug reaction were discussed.   After sterile skin prep, using standard technique, a 14 French tube was placed in the R anterior axillary line above the nipple level using Seldinger technique. Tube hooked up to pleurevac and a sterile dressing was applied.  Findings: Small air  Estimated Blood Loss:  less than 50 mL         Specimens:  None              Complications:  None; patient tolerated the procedure well.         Disposition: ICU - intubated and critically ill.         Condition: unstable  Jamie GelinasBurke Joannah Gitlin, MD, MPH, FACS Trauma: [phone] General Surgery: [phone]

## 2017-01-21 NOTE — Progress Notes (Signed)
Patient ID: Jamie Raymond, female   DOB: 1929-03-27, 83 y.o.   MRN: [id number] I spoke with Dr. Molli KnockYacoub from CCM at the bedside. Appreciate his help. Jamie GelinasBurke Gerrad Welker, MD, MPH, FACS Trauma: [phone] General Surgery: [phone]

## 2017-01-21 NOTE — Discharge Summary (Signed)
DEATH SUMMARY   Patient Details  Name: Jamie Raymond MRN: [id number] DOB: [birth year]  Admission/Discharge Information   Admit Date:  01-27-2017  Date of Death: Date of Death: January 29, 2017  Time of Death: Time of Death: 05-07-1553  Length of Stay: 1  Referring Physician: Patient, No Pcp Per   Reason(s) for Hospitalization  Fall  Diagnoses  Preliminary cause of death: refractory shock Secondary Diagnoses (including complications and co-morbidities):  Active Problems:   Multiple rib fractures   PEA (Pulseless electrical activity) (HCC)   Ventricular tachycardia (HCC)   Refractory shock (HCC)   Acute respiratory failure with hypoxia (HCC)   ARDS (adult respiratory distress syndrome) (HCC)   Aspiration pneumonia (HCC)   Septic shock (HCC)   Goals of care, counseling/discussion   Brief Hospital Course (including significant findings, care, treatment, and services provided and events leading to death)  Akeiba Axelson is a 83 y.o. year old female who experienced a ground level fall while trying to get out of her tub. She hit her right (R) side on the edge of the tub. She was having right sided chest pain worse with movement but no real pain at rest. She was ambulatory at the scene. Patient (Pt) was A&O to person, place, situation and time. She denied LOC. ROS was negative except for right chest wall pain. Workup showed right posterior 7-11 rib fractures, chest wall hematoma, small R sided pleural effusion. Patient was admitted to the trauma service for pain control. Incidental finding of mildly dilated loops of small bowel was seen on CT abd/pel suggesting small bowel obstruction (SBO). Pt had no clinical symptoms of SBO including abdominal pain, distention, nausea or vomiting the morning of 11/19. Pt worked with physical therapy who recommended no follow up. The following morning the pt was found to be unresponsive with emesis in her nares and mouth. It appeared that she aspirated the emesis. She had  agonal breathing. A code was called. The code team responded and CPR was initiated and pt was intubated. NSR was obtained. Pt required levophed due to hypotension. Pt was found to have a right sided pneumothorax after CPR so Dr. Grandville Silos placed a 14 French chest tube on the right side. A central and arterial line was placed. Pt remained hypotensive and acidotic despite all aggressive efforts. Dr. Nelda Marseille and Dr. Grandville Silos met with the family who decided to proceed with comfort measures. A morphine drip was initiated and pressors were discontinued. Pt was extubated and expired shortly after.    Pertinent Labs and Studies  Significant Diagnostic Studies Dg Chest 2 View  Result Date: 01/09/2017 CLINICAL DATA:  Multiple right rib fractures. EXAM: CHEST  2 VIEW COMPARISON:  01/27/2017 FINDINGS: Heart is borderline in size. Diffuse aortic calcifications. Lungs are clear. No effusions or acute bony abnormality. IMPRESSION: Borderline heart size.  No active disease. Aortic atherosclerosis. Electronically Signed   By: Rolm Baptise M.D.   On: 01/09/2017 15:20   Ct Chest W Contrast  Result Date: 01-27-17 CLINICAL DATA:  Patient fell from sitting on the side of the tub. Struck the right side. Pain along the right costal margin. EXAM: CT CHEST, ABDOMEN, AND PELVIS WITH CONTRAST TECHNIQUE: Multidetector CT imaging of the chest, abdomen and pelvis was performed following the standard protocol during bolus administration of intravenous contrast. CONTRAST:  174m ISOVUE-300 IOPAMIDOL (ISOVUE-300) INJECTION 61% COMPARISON:  None. FINDINGS: CT CHEST FINDINGS Cardiovascular: Normal heart size. No pericardial effusion. Coronary artery and aortic valve calcifications. Calcification of the aorta. No aneurysm or  dissection. Mediastinum/Nodes: Esophagus is decompressed. No abnormal mediastinal gas or fluid collections. No significant lymphadenopathy in the chest. Lungs/Pleura: Small pleural thickening or effusion on the right.  Atelectasis in both lung bases. No focal consolidation. No pneumothorax. Airways are patent. Musculoskeletal: Degenerative changes throughout the thoracic spine. No vertebral compression deformities. No sternal depression. Acute comminuted and depressed fractures are demonstrated to the right posterior 11, 10, 9, 8, 7, and sixth ribs. Soft tissue hematoma in the paraspinal spaces. Degenerative changes in the shoulders. No acute left rib fractures. CT ABDOMEN PELVIS FINDINGS Hepatobiliary: Mild diffuse fatty infiltration of the liver. No focal liver lesions identified. Gallbladder and bile ducts are unremarkable. Pancreas: Unremarkable. No pancreatic ductal dilatation or surrounding inflammatory changes. Spleen: No splenic injury or perisplenic hematoma. Adrenals/Urinary Tract: No adrenal hemorrhage or renal injury identified. Parapelvic cysts on the left kidney. Bladder is unremarkable. Stomach/Bowel: Lower esophagus and stomach are unremarkable. Mildly dilated predominantly fluid-filled mid abdominal small bowel. Distal small bowel are relatively decompressed. Transition zone appears to be in the right lower quadrant. No masses identified. Likely this indicates a adhesions. No wall thickening is appreciated. Appendix is not identified. Colon is nondistended and stool-filled. Diverticulosis of the sigmoid colon without evidence of diverticulitis. Vascular/Lymphatic: Aortic atherosclerosis. No enlarged abdominal or pelvic lymph nodes. Reproductive: Status post hysterectomy. No adnexal masses. Other: No free air or free fluid in the abdomen. Abdominal wall musculature appears intact. Musculoskeletal: Degenerative changes in the spine and hips. Spondylolysis with mild spondylolisthesis at L5-S1. No acute displaced fractures identified. IMPRESSION: 1. Multiple acute comminuted and depressed right posterior rib fractures. Associated chest wall hematoma, small right pleural effusion, and atelectasis in the lung bases. 2.  No acute mediastinal injury is demonstrated. 3. No acute posttraumatic changes demonstrated in the abdomen or pelvis. No solid organ injury or bowel perforation. 4. Changes of small bowel obstruction, transition zone in the right lower quadrant. Likely etiology is adhesion. 5. Aortic atherosclerosis. 6. Spondylolysis with mild spondylolisthesis at L5-S1. Electronically Signed   By: Lucienne Capers M.D.   On: 01/04/2017 22:48   Ct Abdomen Pelvis W Contrast  Result Date: 12/27/2016 CLINICAL DATA:  Patient fell from sitting on the side of the tub. Struck the right side. Pain along the right costal margin. EXAM: CT CHEST, ABDOMEN, AND PELVIS WITH CONTRAST TECHNIQUE: Multidetector CT imaging of the chest, abdomen and pelvis was performed following the standard protocol during bolus administration of intravenous contrast. CONTRAST:  199m ISOVUE-300 IOPAMIDOL (ISOVUE-300) INJECTION 61% COMPARISON:  None. FINDINGS: CT CHEST FINDINGS Cardiovascular: Normal heart size. No pericardial effusion. Coronary artery and aortic valve calcifications. Calcification of the aorta. No aneurysm or dissection. Mediastinum/Nodes: Esophagus is decompressed. No abnormal mediastinal gas or fluid collections. No significant lymphadenopathy in the chest. Lungs/Pleura: Small pleural thickening or effusion on the right. Atelectasis in both lung bases. No focal consolidation. No pneumothorax. Airways are patent. Musculoskeletal: Degenerative changes throughout the thoracic spine. No vertebral compression deformities. No sternal depression. Acute comminuted and depressed fractures are demonstrated to the right posterior 11, 10, 9, 8, 7, and sixth ribs. Soft tissue hematoma in the paraspinal spaces. Degenerative changes in the shoulders. No acute left rib fractures. CT ABDOMEN PELVIS FINDINGS Hepatobiliary: Mild diffuse fatty infiltration of the liver. No focal liver lesions identified. Gallbladder and bile ducts are unremarkable. Pancreas:  Unremarkable. No pancreatic ductal dilatation or surrounding inflammatory changes. Spleen: No splenic injury or perisplenic hematoma. Adrenals/Urinary Tract: No adrenal hemorrhage or renal injury identified. Parapelvic cysts on the left kidney. Bladder  is unremarkable. Stomach/Bowel: Lower esophagus and stomach are unremarkable. Mildly dilated predominantly fluid-filled mid abdominal small bowel. Distal small bowel are relatively decompressed. Transition zone appears to be in the right lower quadrant. No masses identified. Likely this indicates a adhesions. No wall thickening is appreciated. Appendix is not identified. Colon is nondistended and stool-filled. Diverticulosis of the sigmoid colon without evidence of diverticulitis. Vascular/Lymphatic: Aortic atherosclerosis. No enlarged abdominal or pelvic lymph nodes. Reproductive: Status post hysterectomy. No adnexal masses. Other: No free air or free fluid in the abdomen. Abdominal wall musculature appears intact. Musculoskeletal: Degenerative changes in the spine and hips. Spondylolysis with mild spondylolisthesis at L5-S1. No acute displaced fractures identified. IMPRESSION: 1. Multiple acute comminuted and depressed right posterior rib fractures. Associated chest wall hematoma, small right pleural effusion, and atelectasis in the lung bases. 2. No acute mediastinal injury is demonstrated. 3. No acute posttraumatic changes demonstrated in the abdomen or pelvis. No solid organ injury or bowel perforation. 4. Changes of small bowel obstruction, transition zone in the right lower quadrant. Likely etiology is adhesion. 5. Aortic atherosclerosis. 6. Spondylolysis with mild spondylolisthesis at L5-S1. Electronically Signed   By: Lucienne Capers M.D.   On: 12/27/2016 22:48   Dg Chest Port 1 View  Result Date: 2017/01/16 CLINICAL DATA:  Chest tube placement EXAM: PORTABLE CHEST 1 VIEW COMPARISON:  2017/01/16 FINDINGS: A chest tube is been placed on the right  with tip located inferomedially on the right. There is extensive subcutaneous air on the right. The previously noted right pneumothorax is smaller and only seen laterally. There is patchy atelectasis in the left mid lung and right base regions. No new opacity evident. Heart size and pulmonary vascularity are normal. There is aortic atherosclerosis. No adenopathy. Endotracheal tube tip is 2.1 cm above the carina. Esophageal probe tip is in the distal esophagus. Nasogastric tube tip and side port are in the stomach. Central catheter tip at cavoatrial junction. Rib fractures on the right inferiorly noted. IMPRESSION: Chest tube placed on the right. Pneumothorax on the right smaller. Extensive subcutaneous emphysema again noted on the right. Areas of patchy atelectasis bilaterally, stable. Stable cardiac silhouette. Endotracheal tube, esophageal probe, nasogastric tube positions as described. There is aortic atherosclerosis. Aortic Atherosclerosis (ICD10-I70.0). Electronically Signed   By: Lowella Grip III M.D.   On: 01/16/17 12:18   Dg Chest Port 1 View  Result Date: Jan 16, 2017 CLINICAL DATA:  Prior fall.  Central line placement. EXAM: PORTABLE CHEST 1 VIEW COMPARISON:  01/09/2017 .  CT FINDINGS: Endotracheal tube 11 mm above the lower portion of the carina. Proximal repositioning of 2 cm should be considered. Left subclavian line noted with tip over the right atrium. NG tube noted coiled stomach. Esophageal probe noted over the lower esophagus. Heart size normal. Aortic atherosclerotic vascular calcification. Multifocal bilateral pulmonary infiltrates. No pleural effusion. Small right sided pneumothorax. Diffuse right chest wall subcutaneous emphysema. Multiple right rib fractures again noted. Degenerative changes both shoulders and thoracic spine. IMPRESSION: 1. Endotracheal tube tip 11 mm above the lower portion of the carina. Proximal repositioning of approximately 2 cm should be considered. Right  subclavian line noted with tip over right atrium. NG tube noted coiled stomach. Esophageal probe noted over the lower esophagus. 2. Small right pneumothorax. Diffuse right chest wall subcutaneous emphysema. Multiple right rib fractures again noted. 3.  Multifocal bilateral pulmonary infiltrates. Critical Value/emergent results were called by telephone at the time of interpretation on 01/16/2017 at 10:18 am to Dr. Nelda Marseille, who verbally acknowledged  these results. Electronically Signed   By: Marcello Moores  Register   On: 08-Feb-2017 10:20   Dg Abd Portable 1v  Result Date: 02/08/17 CLINICAL DATA:  Abdominal distension. EXAM: PORTABLE ABDOMEN - 1 VIEW COMPARISON:  CT scan of January 08, 2017. FINDINGS: Distal tip of nasogastric tube is seen in distal stomach. Moderately dilated small bowel loops are noted concerning for distal small bowel obstruction. Stool is noted in the colon. No abnormal calcifications are noted. IMPRESSION: Nasogastric tube tip seen in expected position of distal stomach. Moderately dilated small bowel loops are noted concerning for distal small bowel obstruction. Electronically Signed   By: Marijo Conception, M.D.   On: February 08, 2017 12:46    Microbiology Recent Results (from the past 240 hour(s))  Culture, blood (routine x 2)     Status: None   Collection Time: 2017/02/08 12:30 PM  Result Value Ref Range Status   Specimen Description BLOOD RIGHT FOREARM  Final   Special Requests   Final    BOTTLES DRAWN AEROBIC ONLY Blood Culture adequate volume   Culture NO GROWTH 5 DAYS  Final   Report Status 01/15/2017 FINAL  Final  Culture, blood (routine x 2)     Status: None   Collection Time: 2017-02-08 12:45 PM  Result Value Ref Range Status   Specimen Description BLOOD RIGHT HAND  Final   Special Requests IN PEDIATRIC BOTTLE Blood Culture adequate volume  Final   Culture NO GROWTH 5 DAYS  Final   Report Status 01/15/2017 FINAL  Final    Lab Basic Metabolic Panel: No results for input(s):  NA, K, CL, CO2, GLUCOSE, BUN, CREATININE, CALCIUM, MG, PHOS in the last 168 hours. Liver Function Tests: No results for input(s): AST, ALT, ALKPHOS, BILITOT, PROT, ALBUMIN in the last 168 hours. No results for input(s): LIPASE, AMYLASE in the last 168 hours. No results for input(s): AMMONIA in the last 168 hours. CBC: No results for input(s): WBC, NEUTROABS, HGB, HCT, MCV, PLT in the last 168 hours. Cardiac Enzymes: No results for input(s): CKTOTAL, CKMB, CKMBINDEX, TROPONINI in the last 168 hours. Sepsis Labs: No results for input(s): PROCALCITON, WBC, LATICACIDVEN in the last 168 hours.  Procedures/Operations  Right sided chest tube: 11/20, Dr. Grandville Silos Intubation: 11/20 Central line: 11/20, Dr. Nelda Marseille Arterial line: 11/20, Dr. Nelda Marseille Brochoscopy: 11/20, Dr. Christoper Allegra Focht 01/20/2017, 12:03 PM

## 2017-01-21 NOTE — Progress Notes (Signed)
Cooling stopped for comfort per Janee Mornhompson verbal order.  Delories HeinzMelissa Zerline Melchior, RN

## 2017-01-21 NOTE — Progress Notes (Addendum)
200ml of fentanyl gtt wasted in sink. Witnessed by Delories HeinzMelissa Garstka RN and Oren BracketAmanda Ajene Carchi RN.

## 2017-01-21 NOTE — Progress Notes (Signed)
[facility]/Trauma Progress Note      Assessment/Plan PEA arrest likely 2/2 hypoxia from aspiration of emesis - transfer to ICU - cardiology and CCM involved and appreciate their assistance  Hx of scoliosis, Osteoporosis, and Arthritis  Fall R posterior 7-11 rib fractures  Chest wall hematoma Small R pleural effusion   LOS: 1 day    Subjective:  When I entered the patient's room around 7:55 I noticed fluid on the floor under the bed and the pt was laying on her left side at the edge of the bed. The pt had dark emesis in her nares and coming out of her mouth. She was unresponsive. I got her nurse and we called a code. The code team came and started CPR. Pt went into V tach with a pulse and was given amio, her HR dropped, went into NSR and her BP dropped. Levo was started. Pt was transferred to ICU. I spoke with Micah FlesherAngela Duke of cardiology to come see the pt and help in her care.   Objective: Vital signs in last 24 hours: Temp:  [97.4 F (36.3 C)-98.4 F (36.9 C)] 97.7 F (36.5 C) (11/20 0429) Pulse Rate:  [64-98] 98 (11/20 0429) Resp:  [16-18] 18 (11/20 0429) BP: (98-138)/(51-66) 98/51 (11/20 0429) SpO2:  [94 %-99 %] 94 % (11/20 0429) Last BM Date: 12/22/2016  Intake/Output from previous day: 11/19 0701 - 11/20 0700 In: 1971.7 [P.O.:800; I.V.:1171.7] Out: 800 [Urine:800] Intake/Output this shift: No intake/output data recorded.  PE: Gen:  Unresponsive, pale HEENT: dark brown emesis in mouth and nares Card:  pulseless Pulm:  agonal breathing Abd: distended    Anti-infectives: Anti-infectives (From admission, onward)   None      Lab Results:  Recent Labs    01/06/2017 1858 01/09/17 0444  WBC 11.3* 9.9  HGB 13.3 12.4  HCT 40.3 37.2  PLT 221 200   BMET Recent Labs    01/09/2017 1858 01/09/17 0444  NA 139 139  K 4.4 4.5  CL 104 105  CO2 26 26  GLUCOSE 134* 124*  BUN 26* 21*  CREATININE 0.86 0.87  CALCIUM 10.3 9.6   PT/INR Recent Labs     12/24/2016 1858  LABPROT 12.8  INR 0.97   CMP     Component Value Date/Time   NA 139 01/09/2017 0444   K 4.5 01/09/2017 0444   CL 105 01/09/2017 0444   CO2 26 01/09/2017 0444   GLUCOSE 124 (H) 01/09/2017 0444   BUN 21 (H) 01/09/2017 0444   CREATININE 0.87 01/09/2017 0444   CALCIUM 9.6 01/09/2017 0444   GFRNONAA 59 (L) 01/09/2017 0444   GFRAA >60 01/09/2017 0444   Lipase  No results found for: LIPASE  Studies/Results: Dg Chest 2 View  Result Date: 01/09/2017 CLINICAL DATA:  Multiple right rib fractures. EXAM: CHEST  2 VIEW COMPARISON:  12/28/2016 FINDINGS: Heart is borderline in size. Diffuse aortic calcifications. Lungs are clear. No effusions or acute bony abnormality. IMPRESSION: Borderline heart size.  No active disease. Aortic atherosclerosis. Electronically Signed   By: Charlett NoseKevin  Dover M.D.   On: 01/09/2017 15:20   Ct Chest W Contrast  Result Date: 12/26/2016 CLINICAL DATA:  Patient fell from sitting on the side of the tub. Struck the right side. Pain along the right costal margin. EXAM: CT CHEST, ABDOMEN, AND PELVIS WITH CONTRAST TECHNIQUE: Multidetector CT imaging of the chest, abdomen and pelvis was performed following the standard protocol during bolus administration of intravenous contrast. CONTRAST:  100mL ISOVUE-300  IOPAMIDOL (ISOVUE-300) INJECTION 61% COMPARISON:  None. FINDINGS: CT CHEST FINDINGS Cardiovascular: Normal heart size. No pericardial effusion. Coronary artery and aortic valve calcifications. Calcification of the aorta. No aneurysm or dissection. Mediastinum/Nodes: Esophagus is decompressed. No abnormal mediastinal gas or fluid collections. No significant lymphadenopathy in the chest. Lungs/Pleura: Small pleural thickening or effusion on the right. Atelectasis in both lung bases. No focal consolidation. No pneumothorax. Airways are patent. Musculoskeletal: Degenerative changes throughout the thoracic spine. No vertebral compression deformities. No sternal  depression. Acute comminuted and depressed fractures are demonstrated to the right posterior 11, 10, 9, 8, 7, and sixth ribs. Soft tissue hematoma in the paraspinal spaces. Degenerative changes in the shoulders. No acute left rib fractures. CT ABDOMEN PELVIS FINDINGS Hepatobiliary: Mild diffuse fatty infiltration of the liver. No focal liver lesions identified. Gallbladder and bile ducts are unremarkable. Pancreas: Unremarkable. No pancreatic ductal dilatation or surrounding inflammatory changes. Spleen: No splenic injury or perisplenic hematoma. Adrenals/Urinary Tract: No adrenal hemorrhage or renal injury identified. Parapelvic cysts on the left kidney. Bladder is unremarkable. Stomach/Bowel: Lower esophagus and stomach are unremarkable. Mildly dilated predominantly fluid-filled mid abdominal small bowel. Distal small bowel are relatively decompressed. Transition zone appears to be in the right lower quadrant. No masses identified. Likely this indicates a adhesions. No wall thickening is appreciated. Appendix is not identified. Colon is nondistended and stool-filled. Diverticulosis of the sigmoid colon without evidence of diverticulitis. Vascular/Lymphatic: Aortic atherosclerosis. No enlarged abdominal or pelvic lymph nodes. Reproductive: Status post hysterectomy. No adnexal masses. Other: No free air or free fluid in the abdomen. Abdominal wall musculature appears intact. Musculoskeletal: Degenerative changes in the spine and hips. Spondylolysis with mild spondylolisthesis at L5-S1. No acute displaced fractures identified. IMPRESSION: 1. Multiple acute comminuted and depressed right posterior rib fractures. Associated chest wall hematoma, small right pleural effusion, and atelectasis in the lung bases. 2. No acute mediastinal injury is demonstrated. 3. No acute posttraumatic changes demonstrated in the abdomen or pelvis. No solid organ injury or bowel perforation. 4. Changes of small bowel obstruction, transition  zone in the right lower quadrant. Likely etiology is adhesion. 5. Aortic atherosclerosis. 6. Spondylolysis with mild spondylolisthesis at L5-S1. Electronically Signed   By: Burman NievesWilliam  Stevens M.D.   On: 2016/05/21 22:48   Ct Abdomen Pelvis W Contrast  Result Date: 2016/05/21 CLINICAL DATA:  Patient fell from sitting on the side of the tub. Struck the right side. Pain along the right costal margin. EXAM: CT CHEST, ABDOMEN, AND PELVIS WITH CONTRAST TECHNIQUE: Multidetector CT imaging of the chest, abdomen and pelvis was performed following the standard protocol during bolus administration of intravenous contrast. CONTRAST:  100mL ISOVUE-300 IOPAMIDOL (ISOVUE-300) INJECTION 61% COMPARISON:  None. FINDINGS: CT CHEST FINDINGS Cardiovascular: Normal heart size. No pericardial effusion. Coronary artery and aortic valve calcifications. Calcification of the aorta. No aneurysm or dissection. Mediastinum/Nodes: Esophagus is decompressed. No abnormal mediastinal gas or fluid collections. No significant lymphadenopathy in the chest. Lungs/Pleura: Small pleural thickening or effusion on the right. Atelectasis in both lung bases. No focal consolidation. No pneumothorax. Airways are patent. Musculoskeletal: Degenerative changes throughout the thoracic spine. No vertebral compression deformities. No sternal depression. Acute comminuted and depressed fractures are demonstrated to the right posterior 11, 10, 9, 8, 7, and sixth ribs. Soft tissue hematoma in the paraspinal spaces. Degenerative changes in the shoulders. No acute left rib fractures. CT ABDOMEN PELVIS FINDINGS Hepatobiliary: Mild diffuse fatty infiltration of the liver. No focal liver lesions identified. Gallbladder and bile ducts are unremarkable. Pancreas: Unremarkable. No  pancreatic ductal dilatation or surrounding inflammatory changes. Spleen: No splenic injury or perisplenic hematoma. Adrenals/Urinary Tract: No adrenal hemorrhage or renal injury identified.  Parapelvic cysts on the left kidney. Bladder is unremarkable. Stomach/Bowel: Lower esophagus and stomach are unremarkable. Mildly dilated predominantly fluid-filled mid abdominal small bowel. Distal small bowel are relatively decompressed. Transition zone appears to be in the right lower quadrant. No masses identified. Likely this indicates a adhesions. No wall thickening is appreciated. Appendix is not identified. Colon is nondistended and stool-filled. Diverticulosis of the sigmoid colon without evidence of diverticulitis. Vascular/Lymphatic: Aortic atherosclerosis. No enlarged abdominal or pelvic lymph nodes. Reproductive: Status post hysterectomy. No adnexal masses. Other: No free air or free fluid in the abdomen. Abdominal wall musculature appears intact. Musculoskeletal: Degenerative changes in the spine and hips. Spondylolysis with mild spondylolisthesis at L5-S1. No acute displaced fractures identified. IMPRESSION: 1. Multiple acute comminuted and depressed right posterior rib fractures. Associated chest wall hematoma, small right pleural effusion, and atelectasis in the lung bases. 2. No acute mediastinal injury is demonstrated. 3. No acute posttraumatic changes demonstrated in the abdomen or pelvis. No solid organ injury or bowel perforation. 4. Changes of small bowel obstruction, transition zone in the right lower quadrant. Likely etiology is adhesion. 5. Aortic atherosclerosis. 6. Spondylolysis with mild spondylolisthesis at L5-S1. Electronically Signed   By: Burman Nieves M.D.   On: 01/16/2017 22:48      Jerre Simon , [facility] 01-23-2017, 8:30 AM Pager: [phone] Consults: [phone] Mon-Fri 7:00 am-4:30 pm Sat-Sun 7:00 am-11:30 am

## 2017-01-21 NOTE — Anesthesia Procedure Notes (Signed)
Procedure Name: Intubation Date/Time: 01/12/17 8:05 AM Performed by: Valda Favia, CRNA Pre-anesthesia Checklist: Emergency Drugs available and Suction available Oxygen Delivery Method: Ambu bag Preoxygenation: Pre-oxygenation with 100% oxygen Laryngoscope Size: Mac and 4 Grade View: Grade I Tube type: Subglottic suction tube Tube size: 7.5 mm Number of attempts: 1 Airway Equipment and Method: Stylet Placement Confirmation: ETT inserted through vocal cords under direct vision,  positive ETCO2,  breath sounds checked- equal and bilateral and CO2 detector Secured at: 23 cm Tube secured with: Tape Dental Injury: Teeth and Oropharynx as per pre-operative assessment  Comments: Arrived to code blue at 0755, RT mask ventilating patient with ambu bag. Brown foul smelling vomit coming from patients mouth. Interrupted ventilation to suction by RT. Patient unresponsive and in asystole. DLx1 with MAC 4 brown emesis suctioned from glottic opening, ETT passed. Positive EtCO2 and bilateral breath sounds present. Airway handoff to Dreamer, RT for ambu bag ventilation.

## 2017-01-21 NOTE — Procedures (Signed)
Bronchoscopy Procedure Note Jamie Raymond [id number] [birth year]  Procedure: Bronchoscopy Indications: Diagnostic evaluation of the airways and Obtain specimens for culture and/or other diagnostic studies  Procedure Details Consent: Risks of procedure as well as the alternatives and risks of each were explained to the (patient/caregiver).  Consent for procedure obtained. Time Out: Verified patient identification, verified procedure, site/side was marked, verified correct patient position, special equipment/implants available, medications/allergies/relevent history reviewed, required imaging and test results available.  Performed  In preparation for procedure, patient was given 100% FiO2. Sedation: Benzodiazepines and Fentanyl  Airway entered and the following bronchi were examined: RUL, RML, RLL, LUL, LLL and Bronchi.   BAL from the RLL Bronchoscope removed.    Evaluation Hemodynamic Status: BP stable throughout; O2 sats: stable throughout Patient's Current Condition: stable Specimens:  Sent purulent fluid Complications: No apparent complications Patient did tolerate procedure well.   Rahul Malinak 2017/01/11

## 2017-01-21 NOTE — Procedures (Addendum)
Bedside Bronchoscopy Procedure Note Jamie DesanctisSarah Gatti [id number] [birth year]  Procedure: Bronchoscopy Indications: Diagnostic evaluation of the airways  Bronch performed by Dr Molli KnockYacoub  Procedure Details: ET Tube Size: ET Tube secured at lip (cm): Bite block in place: No In preparation for procedure, Patient hyper-oxygenated with 100 % FiO2 Airway entered and the following bronchi were examined: Bronchi.   Bronchoscope removed.  , Patient placed back on 100% FiO2 at conclusion of procedure.    Evaluation BP (!) 64/43   Pulse 92   Temp (!) 93 F (33.9 C) (Core)   Resp (!) 25   Ht 5' (1.524 m)   Wt 125 lb (56.7 kg)   SpO2 99%   BMI 24.41 kg/m  Breath Sounds:Clear and Diminished O2 sats: stable throughout Patient's Current Condition: stable Specimens:  Collected, waiting on MD orders for sample.  RN and ICU RT aware Complications: No apparent complications Patient did tolerate procedure well.   [name], [name] 2016/10/04, 10:05 AM

## 2017-01-21 NOTE — Progress Notes (Signed)
Responded to page to continue support to patient family.  Family is withdrawing care. 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 Continued support was referred to [facility] to visit with patients' family and provide end of life spiritual care support and final blessings. Chaplain Hamilton at bedside.   Venida JarvisWatlington, Lakelyn Straus, [place], [facility], Pager [phone]

## 2017-01-21 NOTE — Progress Notes (Signed)
ETT pulled back 2 cm per MD Dr. Molli KnockYacoub.

## 2017-01-21 NOTE — Consult Note (Signed)
PULMONARY / CRITICAL CARE MEDICINE   Name: Claudia DesanctisSarah Lac MRN: [id number] DOB: 20-Jun-1929    ADMISSION DATE:  04-29-2016 CONSULTATION DATE:  01/04/2017  REFERRING MD:  Trauma  CHIEF COMPLAINT:  Cardiac Arrest  HISTORY OF PRESENT ILLNESS:  HPI obtained from chart review as patient is intubated, on mechanical ventilation and sedated.   83 year old female visiting from [place] who suffered ground level fall while trying to get out of bathtub who presented with persistent right chest pain found to have multiple right sided rib fractures, no pneumothorax.  Admitted by Trauma service for pain control.    On the morning of 11/20, patient complained of RUQ pain.  She was found lying in stool  with dark emesis, possibly fecal material, coming from her nose and mouth in which she subsequently aspirated and progressed to PEA arrest.  ACLS measures given, including intubation and 20 mins of CPR prior to ROSC.  Initial rhythm after ROSC was venticular tachycardia which responded to amiodarone bolus with conversion to sinus rhythm.  However, she became hypotensive requiring vasopressor support.  She remained unresponsive, no sedation given, and transferred to the ICU to initiate target temperature management.  Found on CXR to have small right pneumothorax.  Cardiology consulted and PCCM consulted for medical management.    PAST MEDICAL HISTORY :  She  has a past medical history of Arthritis, Osteoporosis, and Scoliosis.  PAST SURGICAL HISTORY: She  has a past surgical history that includes Abdominal hysterectomy.  Allergies  Allergen Reactions  . Amoxicillin Hives and Rash    Details unknown to family  . Penicillins Hives and Rash    Details unknown to family    No current facility-administered medications on file prior to encounter.    Current Outpatient Medications on File Prior to Encounter  Medication Sig  . fluticasone (FLONASE) 50 MCG/ACT nasal spray Place 1-2 sprays daily into both  nostrils.  Marland Kitchen. lisinopril (PRINIVIL,ZESTRIL) 10 MG tablet Take 10 mg daily by mouth.  . pravastatin (PRAVACHOL) 40 MG tablet Take 40 mg daily by mouth.    FAMILY HISTORY:  Her has no family status information on file.    SOCIAL HISTORY: She  reports that  has never smoked. she has never used smokeless tobacco. She reports that she does not drink alcohol or use drugs.  REVIEW OF SYSTEMS:   Unable to assess  SUBJECTIVE:   VITAL SIGNS: BP (!) 109/47   Pulse 75   Temp (!) 89.4 F (31.9 C) (Core)   Resp (!) 30   Ht 5' (1.524 m)   Wt 125 lb (56.7 kg)   SpO2 94%   BMI 24.41 kg/m   HEMODYNAMICS:    VENTILATOR SETTINGS: Vent Mode: PRVC FiO2 (%):  [50 %-100 %] 50 % Set Rate:  [18 bmp-30 bmp] 30 bmp Vt Set:  [370 mL] 370 mL PEEP:  [5 cmH20] 5 cmH20 Plateau Pressure:  [17 cmH20] 17 cmH20  INTAKE / OUTPUT: I/O last 3 completed shifts: In: 2103.3 [P.O.:920; I.V.:1183.3] Out: 1300 [Urine:1300]  PHYSICAL EXAMINATION: General:  Elderly female on MV in NAD HEENT: Pupils 3/minimally reactive/ ETT/OGT Neuro: sedated and paralyzed  CV: rrr, no m/r/g, cooling pads in place PULM: even/non-labored on MV, lungs bilaterally coarse, reduced bibasilar, right ant pigtail CT to 20 cm of suction, no leak GI: distended, firm, scant BS  Extremities: coo/dry, no edema  Skin: no rashes   LABS:  BMET Recent Labs  Lab 2016-06-03 1858 01/09/17 0444  NA 139 139  K 4.4 4.5  CL 104 105  CO2 26 26  BUN 26* 21*  CREATININE 0.86 0.87  GLUCOSE 134* 124*    Electrolytes Recent Labs  Lab 12/25/2016 1858 01/09/17 0444  CALCIUM 10.3 9.6    CBC Recent Labs  Lab 12/28/2016 1858 01/09/17 0444  WBC 11.3* 9.9  HGB 13.3 12.4  HCT 40.3 37.2  PLT 221 200    Coag's Recent Labs  Lab 01/13/2017 1858  INR 0.97    Sepsis Markers No results for input(s): LATICACIDVEN, PROCALCITON, O2SATVEN in the last 168 hours.  ABG Recent Labs  Lab 23-Apr-2016 1009  PHART 7.049*  PCO2ART 56.3*  PO2ART  243.0*    Liver Enzymes No results for input(s): AST, ALT, ALKPHOS, BILITOT, ALBUMIN in the last 168 hours.  Cardiac Enzymes No results for input(s): TROPONINI, PROBNP in the last 168 hours.  Glucose No results for input(s): GLUCAP in the last 168 hours.  Imaging Dg Chest 2 View  Result Date: 01/09/2017 CLINICAL DATA:  Multiple right rib fractures. EXAM: CHEST  2 VIEW COMPARISON:  01/16/2017 FINDINGS: Heart is borderline in size. Diffuse aortic calcifications. Lungs are clear. No effusions or acute bony abnormality. IMPRESSION: Borderline heart size.  No active disease. Aortic atherosclerosis. Electronically Signed   By: Charlett NoseKevin  Dover M.D.   On: 01/09/2017 15:20   Dg Chest Port 1 View  Result Date: 26-Feb-2016 CLINICAL DATA:  Prior fall.  Central line placement. EXAM: PORTABLE CHEST 1 VIEW COMPARISON:  01/09/2017 .  CT FINDINGS: Endotracheal tube 11 mm above the lower portion of the carina. Proximal repositioning of 2 cm should be considered. Left subclavian line noted with tip over the right atrium. NG tube noted coiled stomach. Esophageal probe noted over the lower esophagus. Heart size normal. Aortic atherosclerotic vascular calcification. Multifocal bilateral pulmonary infiltrates. No pleural effusion. Small right sided pneumothorax. Diffuse right chest wall subcutaneous emphysema. Multiple right rib fractures again noted. Degenerative changes both shoulders and thoracic spine. IMPRESSION: 1. Endotracheal tube tip 11 mm above the lower portion of the carina. Proximal repositioning of approximately 2 cm should be considered. Right subclavian line noted with tip over right atrium. NG tube noted coiled stomach. Esophageal probe noted over the lower esophagus. 2. Small right pneumothorax. Diffuse right chest wall subcutaneous emphysema. Multiple right rib fractures again noted. 3.  Multifocal bilateral pulmonary infiltrates. Critical Value/emergent results were called by telephone at the time of  interpretation on 26-Feb-2016 at 10:18 am to Dr. Molli KnockYacoub, who verbally acknowledged these results. Electronically Signed   By: Maisie Fushomas  Register   On: 26-Feb-2016 10:20   STUDIES:  11/18 CT abd / chest >>  1. Multiple acute comminuted and depressed right posterior rib fractures. Associated chest wall hematoma, small right pleural effusion, and atelectasis in the lung bases. 2. No acute mediastinal injury is demonstrated. 3. No acute posttraumatic changes demonstrated in the abdomen or pelvis. No solid organ injury or bowel perforation. 4. Changes of small bowel obstruction, transition zone in the right lower quadrant. Likely etiology is adhesion. 5. Aortic atherosclerosis. 6. Spondylolysis with mild spondylolisthesis at L5-S1.  11/20 EKG>> NSR 82, RAD  11/20 CXR 11/20 >> ETT 11mm above carina, R SVC tip over right atrium, NGT coiled in stomach; esophageal probe in lower esophagus; small right pneumothorax w/ diffuse SQ emphysema; multiple right rib fxs; multifocal bilateral pulmonary infiltrates   CULTURES: 11/20 Trach aspirate >>  ANTIBIOTICS: 11/20 ceftriaxone >> 11/20 Flagyl >>  SIGNIFICANT EVENTS: 11/18 Admit with multiple rib fx s/p fall  11/20  Emesis/ PEA arrest  LINES/TUBES: PIV 11/20 ETT >> 11/20 OGT >> 11/20 L [place] CVL >> 11/20 L radial aline >> 11/20 R chest tube (trauma) >>  DISCUSSION: 71 [facility]F visiting [facility] admitted by Trauma MD 11/18 after fall and multiple right rib fractures.  Vomited 11/20 am, aspirated, and progressed to PEA arrest, to ROSC, to Vtach resolved with amio bolus.  Remains unresponsive and hypotensive post arrest.  Tx to ICU  ASSESSMENT / PLAN:  PULMONARY A: Acute respiratory failure in the setting of aspiration/cardiac arrest Aspiration pneumonitis/ r/o pna Multiple right rib fracture R pneumothorax s/p pigtail CT 11/20 P:   Full MV support, rate adjusted for ABG Change to ARDS protocol, adjust PEEP/ FiO2 per protocol Reassess  ABG now  Daily and PRN CXR and ABG VAP protocol See ID S/p right chest tube placement per Trauma; management per Trauma  CARDIOVASCULAR A:  PEA Cardiac arrest- following emesis/aspiration likely precipated by , 20 mins down prior to ROSC f/b rhythm of Vtach- resolved with amio bolus Shock- cardiogenic +/- septic - no known prior cardiac history P:  Tele monitoring Appreciate Cardiology input Levophed for map > 65 Add vasopressin, consider neo Consider checking co-ox Continue amio gtt Stat TTE Trend CVP Trend Lactic acid Trend troponin and EKG Assess electrolytes on lab now Hold lisinopril and pravastatin  RENAL A:   Acute metabolic acidosis  At risk for AKI r/o prolonged arrest P:   Receiving 2L NS bolus per TTM protocol HCO3 gtt at 150 ml/hr Assess Renal panel now and q 2 hr per TTM protocol Trend BMP / mag/ phos/ urinary output/ daily weights Replace electrolytes as indicated Avoid nephrotoxic agents, ensure adequate renal perfusion  GASTROINTESTINAL A:   Probable SBO- noted on abd CT 11/18 ?due to adhesions, with likely worsening r/t pain management - concern for acute abd P:   NPO Defer to trauma/ CCS S/p OGT- place to LWST PPI for SUP Goal K > 4  HEMATOLOGIC A:   No acute issues P:  Repeat CBC now Assess coags SCDs and lovenox SQ  INFECTIOUS A:   Aspiration pneumonitis - possible fecal material  P:   Empiric ceftriaxone and Flagyl  Trend PCT Send sputum cx Trend WBC  ENDOCRINE A:   Hyperglycemia P:   CBG q 1 hour SSI  NEUROLOGIC A:   Acute encephalopathy concerning for anoxic injury following 20 min PEA cardiac arrest P:   TTM at 33 celsius for neuro protection  RASS goal: -5 PAD protocol with fentanyl/ propofol Nimbex gtt and TOF monitoring Assess EEG  FAMILY  - Updates:  No family at bedside.  Family updated prior by Trauma service.  Family is considering further GOCs.   - Inter-disciplinary family meet or Palliative Care  meeting due by:  01/17/2017  CCT 45 mins  Posey Boyer, AGACNP- [facility] Pulmonary and Critical Care Medicine [facility] Pager: [phone]  12/25/2016, 10:47 AM  Attending Note:  83 year old female with PMH above presenting post cardiac arrest that was related to aspiration from bowel obstruction.  Patient was transferred to the ICU intubated, in cardiogenic shock and ARDS.  On exam, diffuse crackles.  I reviewed CXR myself, right sided PTX, ETT low and line ok, all addressed.  Bronch done to remove feculent material that was aspirated to the best of our ability.  ETT was leaking and was changed.  TLC was placed on the L [place] for pressors and hypothermia protocol  No evidence of  bleeding noted.  A-line placed.  Spoke with family on multiple occassions regarding code status given shock and ARDS.  They continued to ask for full code status.  Will continue support for now.  If BP fails to improve with improvement of acidosis then will consider d/c of hypothermia protocol.  Rocephin and flagyl ordered.  Pan cultures.  The patient is critically ill with multiple organ systems failure and requires high complexity decision making for assessment and support, frequent evaluation and titration of therapies, application of advanced monitoring technologies and extensive interpretation of multiple databases.   Critical Care Time devoted to patient care services described in this note is  120  Minutes. This time reflects time of care of this signee Dr Koren Bound. This critical care time does not reflect procedure time, or teaching time or supervisory time of PA/NP/Med student/Med Resident etc but could involve care discussion time.  Alyson Reedy, M.D. [facility] Pulmonary/Critical Care Medicine. Pager: [phone]. After hours pager: [phone].

## 2017-01-21 DEATH — deceased

## 2018-08-07 IMAGING — DX DG CHEST 2V
2 series · 2 of 2 positions shown · non-contrast
Comparison: 01/08/2017

CLINICAL DATA: Multiple right rib fractures.

EXAM:
CHEST  2 VIEW

[chest lat]
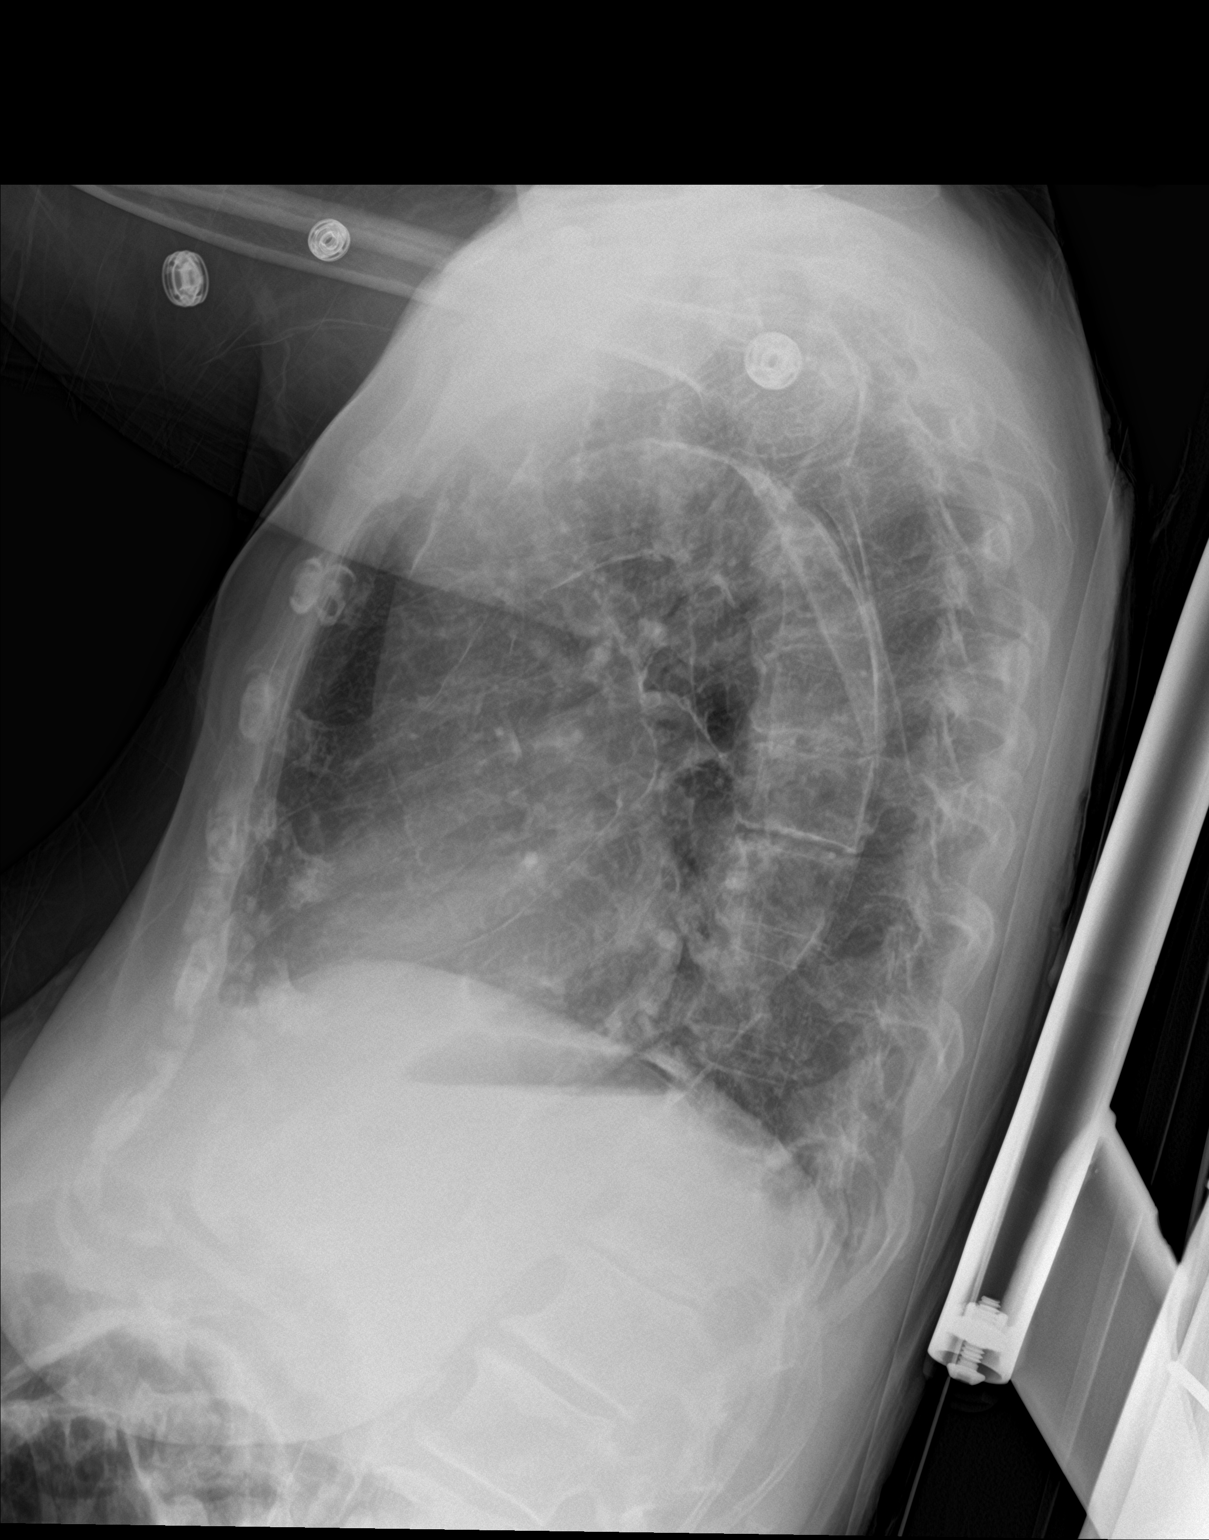

[chest ap]
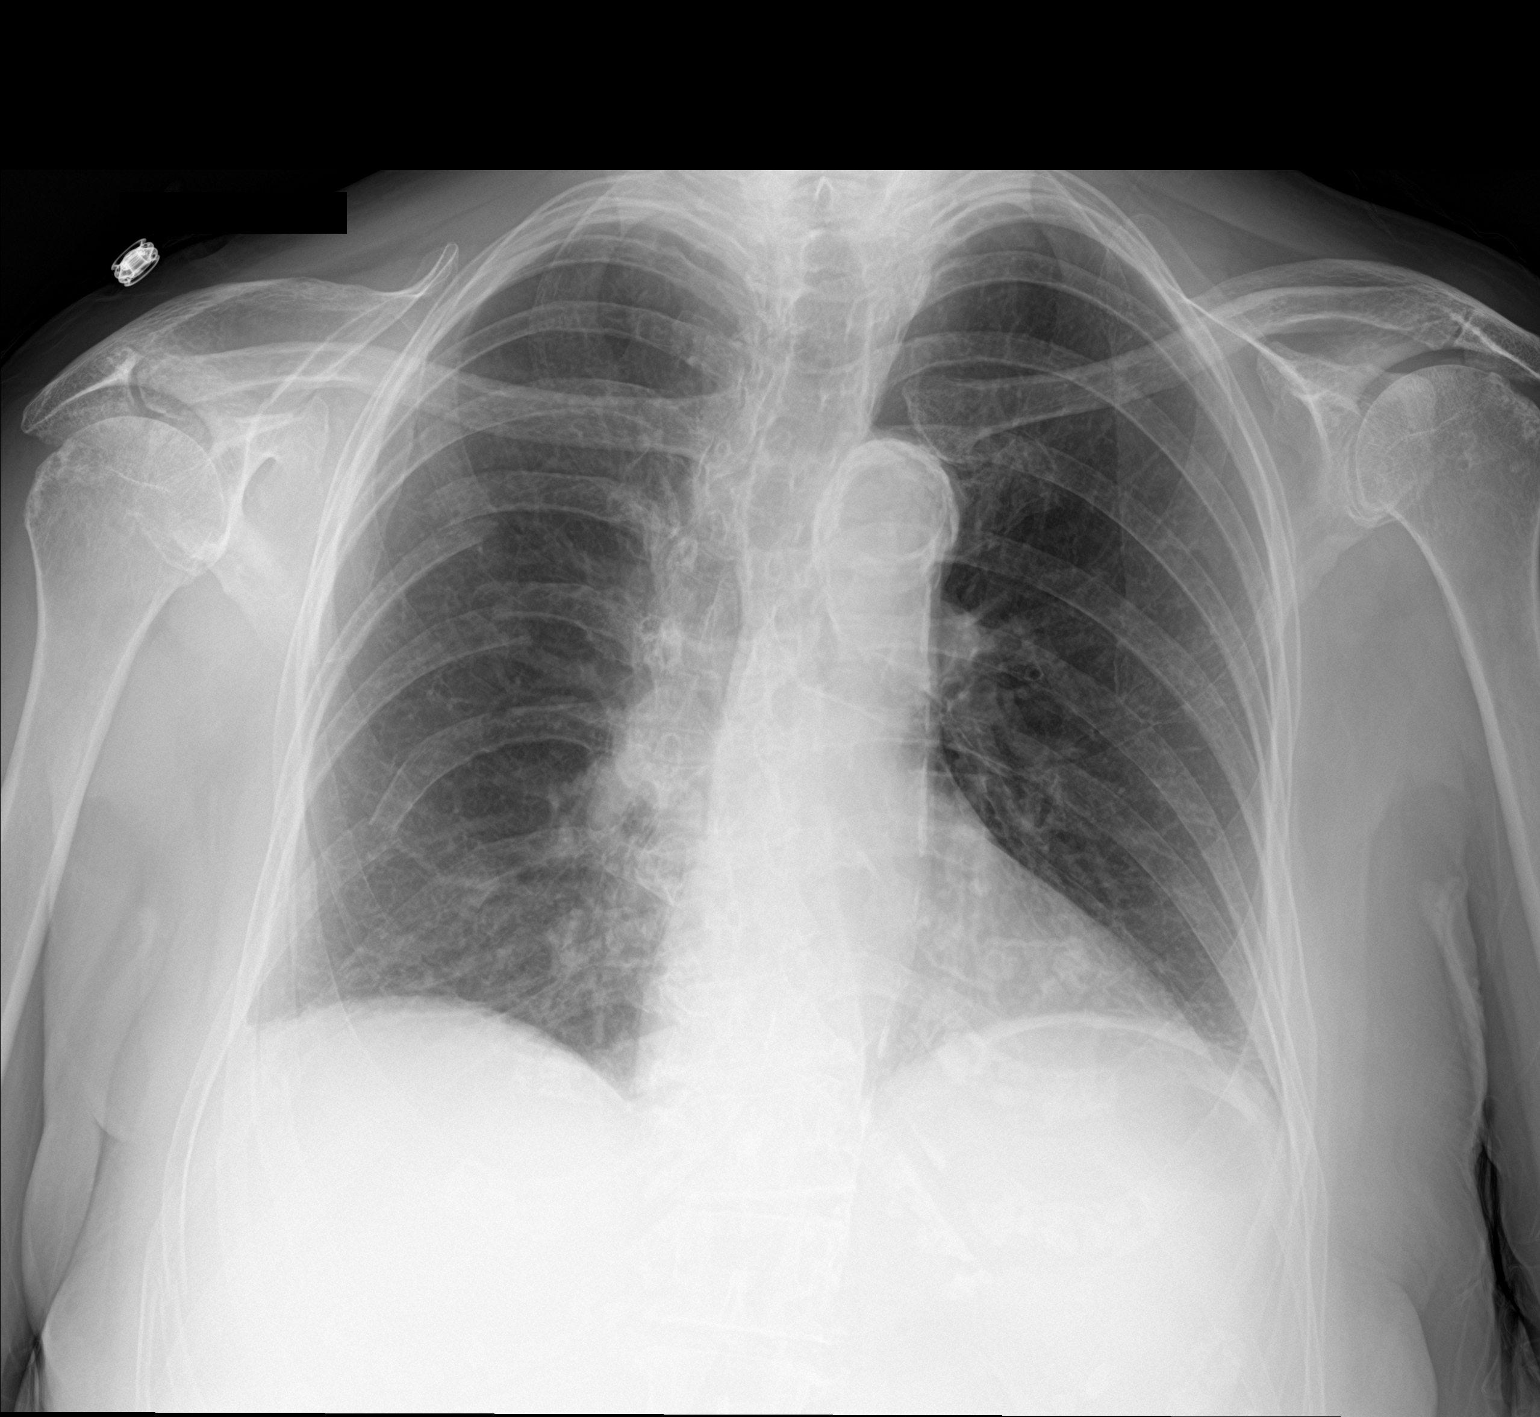

[2 of 2 positions shown; findings below may reference images not displayed]

FINDINGS: Heart is borderline in size. Diffuse aortic calcifications. Lungs
are clear. No effusions or acute bony abnormality.
IMPRESSION: Borderline heart size.  No active disease.

Aortic atherosclerosis.
